# Patient Record
Sex: Female | Born: 1985 | Race: White | Hispanic: No | Marital: Married | Smoking: Current every day smoker
Health system: Southern US, Community
[De-identification: ages and names within clinical notes are randomized; demographics above are authoritative.]

## PROBLEM LIST (undated history)

## (undated) DIAGNOSIS — K219 Gastro-esophageal reflux disease without esophagitis: Secondary | ICD-10-CM

## (undated) DIAGNOSIS — Z79890 Hormone replacement therapy (postmenopausal): Secondary | ICD-10-CM

## (undated) DIAGNOSIS — K529 Noninfective gastroenteritis and colitis, unspecified: Secondary | ICD-10-CM

## (undated) DIAGNOSIS — I10 Essential (primary) hypertension: Secondary | ICD-10-CM

## (undated) DIAGNOSIS — F649 Gender identity disorder, unspecified: Secondary | ICD-10-CM

## (undated) DIAGNOSIS — F17200 Nicotine dependence, unspecified, uncomplicated: Secondary | ICD-10-CM

## (undated) DIAGNOSIS — Z7189 Other specified counseling: Secondary | ICD-10-CM

## (undated) DIAGNOSIS — D72829 Elevated white blood cell count, unspecified: Secondary | ICD-10-CM

## (undated) DIAGNOSIS — Z7289 Other problems related to lifestyle: Secondary | ICD-10-CM

## (undated) HISTORY — PX: TONSILLECTOMY: SUR1361

## (undated) HISTORY — DX: Nicotine dependence, unspecified, uncomplicated: F17.200

## (undated) HISTORY — DX: Elevated white blood cell count, unspecified: D72.829

## (undated) HISTORY — DX: Noninfective gastroenteritis and colitis, unspecified: K52.9

## (undated) HISTORY — DX: Gender identity disorder, unspecified: F64.9

## (undated) HISTORY — DX: Hormone replacement therapy: Z79.890

## (undated) HISTORY — DX: Gastro-esophageal reflux disease without esophagitis: K21.9

## (undated) HISTORY — DX: Other problems related to lifestyle: Z72.89

---

## 2008-09-11 ENCOUNTER — Ambulatory Visit (HOSPITAL_COMMUNITY): Admission: RE | Admit: 2008-09-11 | Discharge: 2008-09-11 | Payer: Self-pay | Admitting: Family Medicine

## 2011-01-14 ENCOUNTER — Ambulatory Visit: Payer: Self-pay | Admitting: Family Medicine

## 2011-01-28 ENCOUNTER — Ambulatory Visit: Payer: Self-pay | Admitting: Family Medicine

## 2011-02-04 ENCOUNTER — Ambulatory Visit (INDEPENDENT_AMBULATORY_CARE_PROVIDER_SITE_OTHER): Payer: 59 | Admitting: Family Medicine

## 2011-02-04 ENCOUNTER — Encounter: Payer: Self-pay | Admitting: Family Medicine

## 2011-02-04 VITALS — Ht 66.75 in | Wt 150.0 lb

## 2011-02-04 DIAGNOSIS — I10 Essential (primary) hypertension: Secondary | ICD-10-CM

## 2011-02-04 NOTE — Progress Notes (Signed)
Medical Nutrition Therapy:  Appt start time: 1130 end time:  1230.  Assessment:  Primary concerns today: blood pressure.  Mr. Yvonne Harrell would like to get his BP under control.  He does not eat regular sodas or flesh foods, but does eat eggs and dairy.  Usual eating pattern includes 3 meals and 0-1 snack per day. Everyday foods include meat substitutes, beans, and milk usually (milk upsets stomach).   24-hr recall suggests an intake of <1000 kcal: B (9 AM)- dill pickle spear, baby carrots, 1 orange, 1 apple; Snk (12 PM)- 1 apple, grape tomatoes; L (3 PM)- BK soy burger w/ chs, pickles, ketchup; D (9 PM)- 1 c Asian noodles w/ 1 c stir-fried veg's, water.  Usual physical activity includes "not much of anything."  Left knee was injured a couple of years ago, and is problematic if he walks much.  [name] eventually admitted to me that he is hesitant to see a physician about his knee b/c he's afraid that surgery would be recommended, w/ cadaver tendon used to repair.  We talked about how his knee limits hiking, which he used to enjoy, though, and that more physical activity would help his weight and BP.  [name] has lost ~10 lb, from a high of 160, but he said he's been unable to lose to a goal weight of 130-140.  We discussed his lack of exercise and possibility that extreme restriction has reduced his resting metabolic rate.  While [name] would like to get off his BP med's, he does now understand the importance of NOT quitting them suddenly or w/out physician approval (as he did last yr).    Progress Towards Goal(s):  In progress.   Nutritional Diagnosis:  NB-2.1 Physical inactivity As related to knee pain from prior injury.  As evidenced by no current exercise. NI-5.10.2 Excessive mineral intake (specify): sodium As related to fast food.  As evidenced by usual daily intake of fast food for lunch.    Intervention:  Nutrition education.  Monitoring/Evaluation:  Dietary intake, exercise, and body weight  in 1 months.

## 2011-02-04 NOTE — Patient Instructions (Addendum)
-   If you decide to pursue evaluation of your knee pain:  [facility]:  [phone] for appt.  (Ask re. Sports Med off [address] - Principles of blood pressure management:  - Keep sodium intake below 1500 mg/day.    - Keep weight below 158 lb.    - Limit alcohol to 1-2 drinks per week.    - Limit fat and saturated fat, especially.    - Physical activity of at least 150 minutes per week (more is better).    - Increase fruits and veg's, aiming for at least 4 veg's and 3 fruits/day.  (These are excellent sources of potassium.)  Yvonne Harrell's goals: 1. Make Sports Med appt for your knee.  2. Exploring gym membership options or access to stationary bike to experiment with your knee.   3. Track sodium intake, and aim for reducing.  This means looking online at fast food nutrient contents (sodium).  (My[employer]itness[employer] app.)  4. Eat at least 3 meals and 1-2 snacks per day.  Aim for no more than 5 hours between eating. 5. Obtain twice as many veg's as protein or carbohydrate foods for both lunch and dinner.  (V8 is an ok substitute at SOME meals, but not as a daily source of fruit; whole, real fruit/veg's are preferred.)  Suggest:  Try stir-frying some leafy greens (chard, kale, spinach) in XV olive oil and garlic.    Suggest:  Better carb sources include sweet potatoes, carrots, beets, parsnips, turnip (use a small amount of this one). 6. Until you manage to get more calcium and iron into your diet, a MVM supplement is probably a good idea.   (ULTIMATE GOAL:  GET OFF BP MED'S.)

## 2011-03-11 ENCOUNTER — Ambulatory Visit: Payer: 59 | Admitting: Family Medicine

## 2011-03-12 ENCOUNTER — Ambulatory Visit (INDEPENDENT_AMBULATORY_CARE_PROVIDER_SITE_OTHER): Payer: 59

## 2011-03-12 DIAGNOSIS — R059 Cough, unspecified: Secondary | ICD-10-CM

## 2011-03-12 DIAGNOSIS — R05 Cough: Secondary | ICD-10-CM

## 2011-03-12 DIAGNOSIS — I10 Essential (primary) hypertension: Secondary | ICD-10-CM

## 2011-03-15 ENCOUNTER — Ambulatory Visit (INDEPENDENT_AMBULATORY_CARE_PROVIDER_SITE_OTHER): Payer: 59

## 2011-03-15 ENCOUNTER — Ambulatory Visit
Admission: RE | Admit: 2011-03-15 | Discharge: 2011-03-15 | Disposition: A | Discharge status: Home / Self Care | Payer: 59 | Source: Ambulatory Visit | Attending: Internal Medicine | Admitting: Internal Medicine

## 2011-03-15 ENCOUNTER — Other Ambulatory Visit: Payer: Self-pay | Admitting: Internal Medicine

## 2011-03-15 DIAGNOSIS — R7989 Other specified abnormal findings of blood chemistry: Secondary | ICD-10-CM

## 2011-03-15 DIAGNOSIS — R1011 Right upper quadrant pain: Secondary | ICD-10-CM

## 2011-03-15 DIAGNOSIS — R05 Cough: Secondary | ICD-10-CM

## 2011-03-15 MED ORDER — IOHEXOL 300 MG/ML  SOLN
100.0000 mL | Freq: Once | INTRAMUSCULAR | Status: AC | PRN
  Administered 2011-03-15: 100 mL via INTRAVENOUS

## 2011-03-15 MED ORDER — ONDANSETRON HCL 4 MG/2ML IJ SOLN
8.0000 mg | Freq: Once | INTRAMUSCULAR | Status: AC
  Administered 2011-03-15: 8 mg via INTRAVENOUS

## 2011-03-16 ENCOUNTER — Ambulatory Visit (INDEPENDENT_AMBULATORY_CARE_PROVIDER_SITE_OTHER): Payer: 59

## 2011-03-16 DIAGNOSIS — R059 Cough, unspecified: Secondary | ICD-10-CM

## 2011-03-16 DIAGNOSIS — I10 Essential (primary) hypertension: Secondary | ICD-10-CM

## 2011-03-16 DIAGNOSIS — R05 Cough: Secondary | ICD-10-CM

## 2011-03-20 ENCOUNTER — Ambulatory Visit (INDEPENDENT_AMBULATORY_CARE_PROVIDER_SITE_OTHER): Payer: 59

## 2011-03-20 DIAGNOSIS — R059 Cough, unspecified: Secondary | ICD-10-CM

## 2011-03-20 DIAGNOSIS — I10 Essential (primary) hypertension: Secondary | ICD-10-CM

## 2011-03-20 DIAGNOSIS — R05 Cough: Secondary | ICD-10-CM

## 2011-03-20 DIAGNOSIS — R000 Tachycardia, unspecified: Secondary | ICD-10-CM

## 2011-03-21 ENCOUNTER — Encounter (HOSPITAL_COMMUNITY): Payer: Self-pay | Admitting: Emergency Medicine

## 2011-03-21 ENCOUNTER — Emergency Department (HOSPITAL_COMMUNITY): Payer: 59

## 2011-03-21 ENCOUNTER — Emergency Department (HOSPITAL_COMMUNITY)
Admission: EM | Admit: 2011-03-21 | Discharge: 2011-03-21 | Disposition: A | Discharge status: Home / Self Care | Payer: 59 | Attending: Emergency Medicine | Admitting: Emergency Medicine

## 2011-03-21 DIAGNOSIS — R000 Tachycardia, unspecified: Secondary | ICD-10-CM | POA: Insufficient documentation

## 2011-03-21 DIAGNOSIS — R079 Chest pain, unspecified: Secondary | ICD-10-CM | POA: Insufficient documentation

## 2011-03-21 DIAGNOSIS — Z79899 Other long term (current) drug therapy: Secondary | ICD-10-CM | POA: Insufficient documentation

## 2011-03-21 DIAGNOSIS — R112 Nausea with vomiting, unspecified: Secondary | ICD-10-CM | POA: Insufficient documentation

## 2011-03-21 DIAGNOSIS — R55 Syncope and collapse: Secondary | ICD-10-CM | POA: Insufficient documentation

## 2011-03-21 DIAGNOSIS — R05 Cough: Secondary | ICD-10-CM | POA: Insufficient documentation

## 2011-03-21 DIAGNOSIS — I10 Essential (primary) hypertension: Secondary | ICD-10-CM | POA: Insufficient documentation

## 2011-03-21 DIAGNOSIS — R059 Cough, unspecified: Secondary | ICD-10-CM | POA: Insufficient documentation

## 2011-03-21 LAB — CBC
HCT: 45.7 % (ref 39.0–52.0)
Hemoglobin: 16.9 g/dL (ref 13.0–17.0)
MCH: 32.8 pg (ref 26.0–34.0)
MCV: 88.7 fL (ref 78.0–100.0)
RBC: 5.15 MIL/uL (ref 4.22–5.81)

## 2011-03-21 LAB — COMPREHENSIVE METABOLIC PANEL
AST: 50 U/L — ABNORMAL HIGH (ref 0–37)
Albumin: 4.6 g/dL (ref 3.5–5.2)
Calcium: 9.5 mg/dL (ref 8.4–10.5)
Chloride: 103 mEq/L (ref 96–112)
Creatinine, Ser: 0.7 mg/dL (ref 0.50–1.35)
Total Protein: 8.3 g/dL (ref 6.0–8.3)

## 2011-03-21 MED ORDER — ONDANSETRON 4 MG PO TBDP: 4.0000 mg | ORAL_TABLET | Freq: Three times a day (TID) | ORAL | Status: AC | PRN

## 2011-03-21 NOTE — ED Provider Notes (Signed)
History     CSN: [id number]  Arrival date & time 03/21/11  4098   First MD Initiated Contact with Patient 03/21/11 2020      No chief complaint on file.   (Consider location/radiation/quality/duration/timing/severity/associated sxs/prior treatment) HPI  The patient is a 76 shoulder, female, who says that he has hereditary.  Tachycardia, and hypertension.  He states that he has had a persistent cough for greater than a month.  He has been seen by several physicians for his symptoms and he does not believe what he has been told so he came for another opinion.  He states that he has been on Tussionex for his cough.  He was effective, however, he does not like the way the hydrocodone makes him feel.  He states that he is unable to keep down any food because he has nausea and vomiting.  He is not nauseated at this time.  However.  He denies fevers, chills, chest pain, abdominal pain.  He says he is a vegetarian.  He always has loose stools.  He has not smoked for greater than one month.  He drinks 20 ounces of caffeinated sodas daily.  He has been prescribed metoprolol 50 mg per day, prednisone for bronchitis Tessalon Perles for cough, Prilosec for gastric reflux.  Amlodipine for hypertension, lisinopril for hypertension  History reviewed. No pertinent past medical history.  History reviewed. No pertinent past surgical history.  No family history on file.  History  Substance Use Topics  . Smoking status: Former Smoker    Quit date: 12/05/2010  . Smokeless tobacco: Never Used  . Alcohol Use: 1.0 oz/week    2 drink(s) per week      Review of Systems  Constitutional: Negative for fever.  HENT: Negative for congestion.   Respiratory: Positive for cough. Negative for shortness of breath.   Cardiovascular: Negative for chest pain.  Gastrointestinal: Positive for nausea and vomiting. Negative for abdominal pain and diarrhea.  Neurological: Positive for syncope. Negative for headaches.    Psychiatric/Behavioral: Negative for confusion.  All other systems reviewed and are negative.    Allergies  Review of patient's allergies indicates no known allergies.  Home Medications   Current Outpatient Rx  Name Route Sig Dispense Refill  . AMLODIPINE BESYLATE 10 MG PO TABS Oral Take 10 mg by mouth daily.    Marland Kitchen BENZONATATE 200 MG PO CAPS Oral Take 200 mg by mouth 3 (three) times daily as needed. For cough.    . METOPROLOL SUCCINATE ER 100 MG PO TB24 Oral Take 100 mg by mouth daily. Take with or immediately following a meal.    . OMEPRAZOLE 40 MG PO CPDR Oral Take 40 mg by mouth daily.    Marland Kitchen PREDNISONE 20 MG PO TABS Oral Take 40 mg by mouth daily.       BP 134/95  Pulse 108  Temp(Src) 99.1 F (37.3 C) (Oral)  Resp 20  SpO2 100%  Physical Exam  Vitals reviewed. Constitutional: He is oriented to person, place, and time. He appears well-developed and well-nourished. No distress.       Hacking intermittent, cough  HENT:  Head: Normocephalic and atraumatic.  Eyes: EOM are normal. Pupils are equal, round, and reactive to light.  Neck: Normal range of motion. Neck supple. No thyromegaly present.  Cardiovascular: Regular rhythm and normal heart sounds.   No murmur heard.      Tachycardia  Pulmonary/Chest: Effort normal and breath sounds normal. No respiratory distress. He has no wheezes.  He has no rales.  Musculoskeletal: Normal range of motion. He exhibits no edema and no tenderness.  Neurological: He is alert and oriented to person, place, and time. No cranial nerve deficit.  Skin: Skin is warm and dry. He is not diaphoretic.  Psychiatric: He has a normal mood and affect. His behavior is normal.    ED Course  Procedures (including critical care time)  Labs Reviewed  COMPREHENSIVE METABOLIC PANEL - Abnormal; Notable for the following:    Glucose, Bld 137 (*)    AST 50 (*)    All other components within normal limits  CBC - Abnormal; Notable for the following:    WBC  11.9 (*)    MCHC 37.0 (*)    All other components within normal limits   Dg Chest 2 View  03/21/2011  *RADIOLOGY REPORT*  Clinical Data: Cough, chest pain  CHEST - 2 VIEW  Comparison: None.  Findings: Lungs are clear. No pleural effusion or pneumothorax.  Cardiomediastinal silhouette is within normal limits.  Visualized osseous structures are within normal limits.  IMPRESSION: No evidence of acute cardiopulmonary disease.  Original Report Authenticated By: Charline Bills, M.D.     No diagnosis found.    MDM  Tachycardia Bronchitis        Nicholes Stairs, MD 03/21/11 2122

## 2011-03-21 NOTE — ED Notes (Signed)
Caporossi, MD at bedside. 

## 2011-03-21 NOTE — ED Notes (Signed)
Per Pt: syncopal episode with LOC (breathing normal) today per wife that lasted several minutes on couch. No injury.  Cough for several weeks, has had EKG, CT, Xray at Urgent Care. Reports he is here because he feels like he is "getting worse". Pt VSS, in no apparent distress, ambulates WNL.

## 2011-03-25 DIAGNOSIS — Z0271 Encounter for disability determination: Secondary | ICD-10-CM

## 2011-10-24 ENCOUNTER — Encounter (HOSPITAL_COMMUNITY): Payer: Self-pay | Admitting: Emergency Medicine

## 2011-10-24 ENCOUNTER — Emergency Department (HOSPITAL_COMMUNITY)
Admission: EM | Admit: 2011-10-24 | Discharge: 2011-10-24 | Disposition: A | Discharge status: Home / Self Care | Payer: 59 | Attending: Emergency Medicine | Admitting: Emergency Medicine

## 2011-10-24 DIAGNOSIS — Z79899 Other long term (current) drug therapy: Secondary | ICD-10-CM | POA: Insufficient documentation

## 2011-10-24 DIAGNOSIS — F411 Generalized anxiety disorder: Secondary | ICD-10-CM | POA: Insufficient documentation

## 2011-10-24 DIAGNOSIS — F419 Anxiety disorder, unspecified: Secondary | ICD-10-CM

## 2011-10-24 DIAGNOSIS — I10 Essential (primary) hypertension: Secondary | ICD-10-CM

## 2011-10-24 DIAGNOSIS — F17200 Nicotine dependence, unspecified, uncomplicated: Secondary | ICD-10-CM | POA: Insufficient documentation

## 2011-10-24 HISTORY — DX: Essential (primary) hypertension: I10

## 2011-10-24 HISTORY — DX: Other specified counseling: Z71.89

## 2011-10-24 LAB — POCT I-STAT, CHEM 8
Chloride: 101 mEq/L (ref 96–112)
Creatinine, Ser: 0.7 mg/dL (ref 0.50–1.35)
Glucose, Bld: 109 mg/dL — ABNORMAL HIGH (ref 70–99)
Potassium: 3.5 mEq/L (ref 3.5–5.1)
Sodium: 140 mEq/L (ref 135–145)

## 2011-10-24 MED ORDER — LORAZEPAM 1 MG PO TABS
1.0000 mg | ORAL_TABLET | Freq: Once | ORAL | Status: AC
  Administered 2011-10-24: 1 mg via ORAL
  Filled 2011-10-24: qty 1

## 2011-10-24 MED ORDER — LORAZEPAM 1 MG PO TABS: 1.0000 mg | ORAL_TABLET | Freq: Three times a day (TID) | ORAL | Status: AC | PRN

## 2011-10-24 NOTE — ED Provider Notes (Signed)
History     CSN: [id number]  Arrival date & time 10/24/11  1032   First MD Initiated Contact with Patient 10/24/11 1117      Chief Complaint  Patient presents with  . Hypertension    (Consider location/radiation/quality/duration/timing/severity/associated sxs/prior treatment) HPI Comments: Yvonne Harrell is a 26 y.o. Female who presents to ER with elevated blood pressure. PT states he started HRT for sex change 4 days ago. Since then he noticed feeling "bad." Reports nausea, intermittent vomiting, sweats, and elevated blood pressure. Pt states yesterday, he measured his BP and it was 200/110. Pt does take BP medication, he takes lopressor, and no recent dosage changes have been made. BP has been managed well on this medication. Per pt's wife, pt has not been feeling well since his replacement. He has not slept last night at all. Pt is very anxious. He however denies chest pain, SOB, leg swelling, abdominal pain, problems with bowels or urination.    Past Medical History  Diagnosis Date  . Counseling for hormone replacement therapy     started replacement therapy on 10/21/11  . Hypertension     Past Surgical History  Procedure Date  . Tonsillectomy     History reviewed. No pertinent family history.  History  Substance Use Topics  . Smoking status: Current Everyday Smoker -- 0.5 packs/day    Last Attempt to Quit: 12/05/2010  . Smokeless tobacco: Never Used  . Alcohol Use: 1.0 oz/week    2 drink(s) per week     occasionally      Review of Systems  Constitutional: Positive for diaphoresis. Negative for fever, chills and fatigue.  HENT: Negative for neck pain and neck stiffness.   Respiratory: Negative.   Cardiovascular: Negative.   Gastrointestinal: Positive for nausea and vomiting. Negative for abdominal pain.  Genitourinary: Negative for dysuria and flank pain.  Musculoskeletal: Negative.   Skin: Negative.   Neurological: Negative for dizziness, weakness, numbness  and headaches.    Allergies  Review of patient's allergies indicates no known allergies.  Home Medications   Current Outpatient Rx  Name Route Sig Dispense Refill  . ASPIRIN EC 81 MG PO TBEC Oral Take 81 mg by mouth daily.    Marland Kitchen ESTRADIOL 2 MG PO TABS Oral Take 2 mg by mouth 2 (two) times daily.    Marland Kitchen MEDROXYPROGESTERONE ACETATE 400 MG/ML IM SUSP Intramuscular Inject 200 mg into the muscle every 3 (three) months.    Marland Kitchen METOPROLOL TARTRATE 50 MG PO TABS Oral Take 50 mg by mouth 2 (two) times daily.      BP 155/104  Pulse 84  Temp 99.7 F (37.6 C) (Oral)  Resp 16  SpO2 100%  Physical Exam  Nursing note and vitals reviewed. Constitutional: He is oriented to person, place, and time. He appears well-developed and well-nourished. No distress.  HENT:  Head: Normocephalic and atraumatic.  Eyes: Conjunctivae are normal.  Neck: Neck supple.  Cardiovascular: Normal rate, regular rhythm and normal heart sounds.   Pulmonary/Chest: Effort normal and breath sounds normal. No respiratory distress. He has no wheezes. He has no rales.  Abdominal: Soft. Bowel sounds are normal. He exhibits no distension. There is no tenderness. There is no rebound.  Musculoskeletal: He exhibits no edema.       No calf tenderness or LE swelling  Neurological: He is alert and oriented to person, place, and time.       Pt very tremulous. Per him and his wife, that is his baseline  Skin: Skin is warm. No rash noted. He is diaphoretic.  Psychiatric:       Pt appears very anxious    ED Course  Procedures (including critical care time)   Date: 10/24/2011  Rate: 85  Rhythm: normal sinus rhythm  QRS Axis: normal  Intervals: borderline prolonged QT  ST/T Wave abnormalities: normal  Conduction Disutrbances:none  Narrative Interpretation:   Old EKG Reviewed: none available  Pt very anxious. Hx of the same. BP elevated here. No missed  Doses per him. WIll get renal function, troponin, ecg. Results for orders  placed during the hospital encounter of 10/24/11  POCT I-STAT TROPONIN I      Component Value Range   Troponin i, poc 0.00  0.00 - 0.08 ng/mL   Comment 3           POCT I-STAT, CHEM 8      Component Value Range   Sodium 140  135 - 145 mEq/L   Potassium 3.5  3.5 - 5.1 mEq/L   Chloride 101  96 - 112 mEq/L   BUN 6  6 - 23 mg/dL   Creatinine, Ser 1.61  0.50 - 1.35 mg/dL   Glucose, Bld 096 (*) 70 - 99 mg/dL   Calcium, Ion 0.45  4.09 - 1.23 mmol/L   TCO2 25  0 - 100 mmol/L   Hemoglobin 17.7 (*) 13.0 - 17.0 g/dL   HCT 81.1  91.4 - 78.2 %   1:18 PM Pt hypertensive, normal renal function, normal ECG. He is anxious, i gave him ativan. He has no signs of possible PE or dvt. i suspect his symptoms are related to newly started dep and estradiol. I explained to him that no additional treatment necessary at this point, but he must call his physician and notify them of his symptoms and follow up with them as soon as possible. Return if symptoms worsening. Ativan for anxiety. Do not check BP every 20 min as he did yesterday.     1. Hypertension   2. Anxiety       MDM          Lottie Mussel, PA 10/24/11 1320

## 2011-10-24 NOTE — ED Notes (Addendum)
Here because BP not controlled with medicine, has had recently started HRT-- 158/98 manually. Has been nauseated, vomited last night.  Gave self Depo shot in right gluteus- states it is red and painful -- given on Monday

## 2011-10-24 NOTE — ED Notes (Signed)
PA at bedside Pt alert and oriented x4. Respirations even and unlabored, bilateral symmetrical rise and fall of chest. Skin warm and dry. In no acute distress. Denies needs.   

## 2011-10-25 NOTE — ED Provider Notes (Signed)
Medical screening examination/treatment/procedure(s) were conducted as a shared visit with non-physician practitioner(s) and myself.  I personally evaluated the patient during the encounter     Cyndra Numbers, MD 10/25/11 [phone]

## 2012-01-05 ENCOUNTER — Other Ambulatory Visit: Payer: Self-pay | Admitting: Family Medicine

## 2012-01-05 ENCOUNTER — Other Ambulatory Visit: Payer: Self-pay | Admitting: Physician Assistant

## 2012-01-05 NOTE — Addendum Note (Signed)
Addended by: Fernande Bras on: 01/05/2012 06:56 PM   Modules accepted: Orders

## 2012-01-27 ENCOUNTER — Ambulatory Visit (INDEPENDENT_AMBULATORY_CARE_PROVIDER_SITE_OTHER): Payer: 59 | Admitting: Physician Assistant

## 2012-01-27 ENCOUNTER — Encounter: Payer: Self-pay | Admitting: Physician Assistant

## 2012-01-27 VITALS — BP 122/78 | HR 109 | Temp 98.4°F | Resp 18 | Ht 66.0 in | Wt 155.0 lb

## 2012-01-27 DIAGNOSIS — I10 Essential (primary) hypertension: Secondary | ICD-10-CM

## 2012-01-27 DIAGNOSIS — Z720 Tobacco use: Secondary | ICD-10-CM

## 2012-01-27 DIAGNOSIS — B009 Herpesviral infection, unspecified: Secondary | ICD-10-CM

## 2012-01-27 DIAGNOSIS — F17200 Nicotine dependence, unspecified, uncomplicated: Secondary | ICD-10-CM

## 2012-01-27 DIAGNOSIS — B001 Herpesviral vesicular dermatitis: Secondary | ICD-10-CM

## 2012-01-27 DIAGNOSIS — Z79899 Other long term (current) drug therapy: Secondary | ICD-10-CM

## 2012-01-27 LAB — COMPREHENSIVE METABOLIC PANEL
ALT: 20 U/L (ref 0–53)
Albumin: 4.8 g/dL (ref 3.5–5.2)
Alkaline Phosphatase: 72 U/L (ref 39–117)
Potassium: 4.2 mEq/L (ref 3.5–5.3)
Sodium: 135 mEq/L (ref 135–145)
Total Bilirubin: 0.3 mg/dL (ref 0.3–1.2)
Total Protein: 7 g/dL (ref 6.0–8.3)

## 2012-01-27 LAB — CBC
MCH: 32.5 pg (ref 26.0–34.0)
MCHC: 36.4 g/dL — ABNORMAL HIGH (ref 30.0–36.0)
MCV: 89.3 fL (ref 78.0–100.0)
Platelets: 336 10*3/uL (ref 150–400)
RDW: 12.5 % (ref 11.5–15.5)

## 2012-01-27 MED ORDER — VARENICLINE TARTRATE 1 MG PO TABS: 1.0000 mg | ORAL_TABLET | Freq: Two times a day (BID) | ORAL | Status: DC

## 2012-01-27 MED ORDER — METOPROLOL TARTRATE 50 MG PO TABS: 50.0000 mg | ORAL_TABLET | Freq: Two times a day (BID) | ORAL | Status: DC

## 2012-01-27 MED ORDER — VARENICLINE TARTRATE 0.5 MG X 11 & 1 MG X 42 PO MISC: ORAL | Status: DC

## 2012-01-27 MED ORDER — VALACYCLOVIR HCL 1 G PO TABS: ORAL_TABLET | ORAL | Status: DC

## 2012-01-27 MED ORDER — SPIRONOLACTONE-HCTZ 25-25 MG PO TABS: 1.0000 | ORAL_TABLET | Freq: Every day | ORAL | Status: DC

## 2012-01-27 MED ORDER — LISINOPRIL 20 MG PO TABS: 20.0000 mg | ORAL_TABLET | Freq: Every day | ORAL | Status: DC

## 2012-01-27 NOTE — Progress Notes (Signed)
Patient ID: Yvonne Harrell MRN: [id number], DOB: 03-28-1985, 26 y.o. Date of Encounter: 01/27/2012, 1:10 PM  Primary Physician: No primary provider on file.  Chief Complaint: HTN  HPI: 26 y.o. year old female with history below presents for hypertension follow up. Doing well. Blood pressure is much improved since prior ED visits earlier this year. Currently taking Lisinopril 20 mg daily, Metoprolol 50 mg BID, Aldactazide 25/25 mg daily without issue. Was started on the Aldactazide by Dr. Ruby Cola, MD managing his HRT, in October. Has felt fine since his ED visit for elevated BP in August. No chest pain, shortness of breath, palpitations, tachypnea, or dyspnea. No headache or focal deficits.   He also requests to restart Chantix. We originally wrote this for him about one year ago, and he almost completed therapy. He did stop smoking completely. However, he did go through a stressful time in his life causing him to go back to smoking tobacco. He wants to quit again and requests a prescription for the starter pack today. He also understands the increased risk of tobacco use and HRT causing potentially life threatening blood clots in his legs and lungs.   He requests a refill of his Valtrex episodic prescription for herpes labialis. No current flare, but prior prescription expired, and he needs a new one to have on hand.   His HRT is managed by Dr. Rosario Jacks. He was last seen one month ago for routine follow up. No issues were found at that visit. His next follow up is May 2014. He is not having any adverse effects with his HRT. No chest pain, SOB, dyspnea, or tachypnea. No swelling, erythema, or pain in the legs. He is aware that HRT can cause life threatening blood clots and that smoking increases this risk. He accepts this risk.     Past Medical History  Diagnosis Date  . Counseling for hormone replacement therapy     started replacement therapy on 10/21/11  . Hypertension      Home  Meds: Prior to Admission medications   Medication Sig Start Date End Date Taking? Authorizing Provider  aspirin EC 81 MG tablet Take 81 mg by mouth daily.   Yes Historical Provider, MD  estradiol (ESTRACE) 2 MG tablet Take 2 mg by mouth 2 (two) times daily.   Yes Historical Provider, MD  medroxyPROGESTERone (DEPO-PROVERA) 400 MG/ML SUSP Inject 200 mg into the muscle every 3 (three) months.   Yes Historical Provider, MD  metoprolol (LOPRESSOR) 50 MG tablet Take 50 mg by mouth 2 (two) times daily.   Yes Historical Provider, MD    Allergies: No Known Allergies  History   Social History  . Marital Status: Married    Spouse Name: N/A    Number of Children: N/A  . Years of Education: N/A   Occupational History  . Not on file.   Social History Main Topics  . Smoking status: Current Every Day Smoker -- 0.5 packs/day    Last Attempt to Quit: 12/05/2010  . Smokeless tobacco: Never Used  . Alcohol Use: 1.0 oz/week    2 drink(s) per week     Comment: occasionally  . Drug Use: No  . Sexually Active: Yes    Birth Control/ Protection: None   Other Topics Concern  . Not on file   Social History Narrative  . No narrative on file     No family history on file.  Review of Systems: Constitutional: negative for chills, fever, night sweats, weight changes,  or fatigue  HEENT: negative for vision changes or hearing loss Cardiovascular: negative for chest pain, palpitations, or DOE Respiratory: negative for hemoptysis, wheezing, dyspnea, tachypnea, shortness of breath, or cough Abdominal: negative for abdominal pain, nausea, vomiting, diarrhea, or constipation Dermatological: negative for rash Neurologic: negative for headache, dizziness, or syncope   Physical Exam: Blood pressure 122/78, pulse 109, temperature 98.4 F (36.9 C), temperature source Oral, resp. rate 18, height 5\' 6"  (1.676 m), weight 155 lb (70.308 kg), SpO2 100.00%., Body mass index is 25.02 kg/(m^2). General: Well  developed, well nourished, in no acute distress. Head: Normocephalic, atraumatic, eyes without discharge, sclera non-icteric, nares are without discharge. Bilateral auditory canals clear, TM's are without perforation, pearly grey and translucent with reflective cone of light bilaterally. Oral cavity moist, posterior pharynx without exudate, erythema, peritonsillar abscess, or post nasal drip.  Neck: Supple. No thyromegaly. Full ROM. No lymphadenopathy. No carotid bruits. Lungs: Clear bilaterally to auscultation without wheezes, rales, or rhonchi. Breathing is unlabored. Heart: RRR with S1 S2. No murmurs, rubs, or gallops appreciated.  Msk:  Strength and tone normal for age. Extremities/Skin: Warm and dry. No clubbing or cyanosis. No edema. No rashes or suspicious lesions. Distal pulses 2+ and equal bilaterally. Calves supple and equal circumference bilaterally.  Neuro: Alert and oriented X 3. Moves all extremities spontaneously. Gait is normal. CNII-XII grossly in tact. DTR 2+, cerebellar function intact. Rhomberg normal. Psych:  Responds to questions appropriately with a normal affect. Mildly anxious.   Labs:  CMP and CBC pending.  ASSESSMENT AND PLAN:  26 y.o. year old female with well controlled hypertension, tobacco use, history of herpes labialis, and HRT/high risk medication use.   1. Hypertension -Well controlled -Continue current treatment -Refilled medication -Initially he requests that we just refill his Metoprolol today stating that Dr. Ruby Cola has been writing for his Lisinopril and Aldactazide. However, I feel it would be safer for the patient if just one prescriber were managing his hypertension, so we will manage it for him. He feels like this is a good plan. -Lisinopril 20 mg 1 po daily #90 RF 2 -Metoprolol 50 mg 1 po bid #180 RF 2 -Aldactazide 25/25 mg 1 po daily #90 RF 2 -Healthy diet and exercise -Weight loss -Await labs -Follow up 6-8 months  2. Tobacco use -Knows he  must stop smoking -Wants to stop smoking -Knows he is at an increased risk of DVT/PE with concurrent use of HRT, he accepts this risk -Previously on Chantix and tolerated well. Unfortunately restarted smoking again during stressful time in life. He requests to restart this again today.  -Chantix Starter Kit As directed #1 no RF -Chantix Continuing month Kit As directed RF 3, to keep on file -Patient given strict RTC/ER precautions -Safety contract  3. Herpes labialis  -No current flare -Valtrex 1000 mg 2 po q 12 hours for 1 day, start with symptom onset #90 RF 2  4. HRT/High risk medication use -Followed by Dr. Ruby Cola -Next appointment May 2014 -Patient already knows and understands risks of HRT/smoking with DVT/PE -Patient knows symptoms of DVT/PE -Following provider does have him on daily aspirin   Signed, Eula Listen, PA-C 01/27/2012 1:10 PM

## 2012-01-28 ENCOUNTER — Other Ambulatory Visit: Payer: Self-pay | Admitting: *Deleted

## 2012-01-28 MED ORDER — VARENICLINE TARTRATE 0.5 MG X 11 & 1 MG X 42 PO MISC: ORAL | Status: DC

## 2012-01-28 MED ORDER — VARENICLINE TARTRATE 1 MG PO TABS: 1.0000 mg | ORAL_TABLET | Freq: Two times a day (BID) | ORAL | Status: DC

## 2012-01-29 ENCOUNTER — Encounter: Payer: Self-pay | Admitting: *Deleted

## 2012-02-05 ENCOUNTER — Telehealth: Payer: Self-pay

## 2012-02-05 NOTE — Telephone Encounter (Signed)
PT STATES HE RECEIVED A LETTER STATING WE HAD BEEN TRYING TO GET IN [facility] WITH HIM PLEASE CALL [phone]

## 2012-02-06 NOTE — Telephone Encounter (Signed)
Contacted pt and gave him lab results and instr's to come back to have them rechecked in 1 -3 weeks from now. Pt agreed. Also faxed a copy of labs to Dr Ruby Cola per Ryan's request.

## 2012-02-13 ENCOUNTER — Telehealth: Payer: Self-pay

## 2012-02-13 NOTE — Telephone Encounter (Signed)
Pt states his rx for spironolactone hctz was dispensed incorrectly. States he only received 30 pills instead of 90. And instructions say take 1 by mouth daily when previous instructions said take 2.   Pt concerned.  Please call. Bf  Pharmacy: [facility] [address]

## 2012-02-14 NOTE — Telephone Encounter (Signed)
According to last OV, Dr. Ruby Cola was prescribing this previously, and our records show he was on 1 tablet daily. He was given #90 with 2 RF on 11/26.

## 2012-02-14 NOTE — Telephone Encounter (Signed)
Called [facility] pharmacy to verify Aldactazide 25/25 mg. He previously had been on this medication bid. His insurance will only cover 30 day supplies per [facility]. Please notify the patient that I have changed his medication to bid, he can double up the bottle he just purchased and take that bid until he runs out then pick up the new bottle. Please also notify him that they are stating his insurance will only allow 30 day supply. He can call his insurance to discuss this with them if he would like. I am sorry for the confusion.

## 2012-02-14 NOTE — Telephone Encounter (Signed)
Chart at PA desk 

## 2012-02-14 NOTE — Telephone Encounter (Signed)
Thanks, I called patient to advise, left message, he was advised.

## 2012-02-24 ENCOUNTER — Encounter: Payer: Self-pay | Admitting: Physician Assistant

## 2012-02-24 ENCOUNTER — Ambulatory Visit (INDEPENDENT_AMBULATORY_CARE_PROVIDER_SITE_OTHER): Payer: 59 | Admitting: Physician Assistant

## 2012-02-24 DIAGNOSIS — Z79899 Other long term (current) drug therapy: Secondary | ICD-10-CM

## 2012-02-24 DIAGNOSIS — D7289 Other specified disorders of white blood cells: Secondary | ICD-10-CM

## 2012-02-24 LAB — COMPREHENSIVE METABOLIC PANEL
ALT: 22 U/L (ref 0–53)
Alkaline Phosphatase: 63 U/L (ref 39–117)
Creat: 0.78 mg/dL (ref 0.50–1.35)
Sodium: 133 mEq/L — ABNORMAL LOW (ref 135–145)
Total Bilirubin: 0.5 mg/dL (ref 0.3–1.2)
Total Protein: 7.6 g/dL (ref 6.0–8.3)

## 2012-02-24 LAB — CBC
MCH: 32.5 pg (ref 26.0–34.0)
MCHC: 35.8 g/dL (ref 30.0–36.0)
MCV: 90.7 fL (ref 78.0–100.0)
Platelets: 319 10*3/uL (ref 150–400)
RBC: 4.28 MIL/uL (ref 4.22–5.81)
RDW: 12.8 % (ref 11.5–15.5)

## 2012-02-24 NOTE — Progress Notes (Signed)
Patient ID: Yvonne Harrell MRN: [id number], DOB: [birth year], 26 y.o. Date of Encounter: 02/24/2012, 4:21 PM  Primary Physician: No primary provider on file.  Chief Complaint: Recheck labs  HPI: 26 y.o. year old female with history below presents for recheck of elevated calcium and leukocytosis drawn on 01/27/12 office visit. Patient has been taking combo vitamin D and calcium OTC supplement bid as well as spironolactone/HCTZ 25/25 mg bid. Since he was notified of his elevated calcium at his last visit he decreased his OTC calcium supplement to once daily. At this time he is still taking the spironolactone/HCTZ combo.   He is also here to recheck a leukocytosis from the same office visit. He states that he was feeling fine at the time of that office visit. No recent illnesses or pains. There had been several coworkers sick, but not himself.   He is tolerating the Chantix without issues. He quit smoking tobacco about two weeks ago and is doing well. He states that "they just don't taste good."    Past Medical History  Diagnosis Date  . Counseling for hormone replacement therapy     started replacement therapy on 10/21/11  . Hypertension      Home Meds: Prior to Admission medications   Medication Sig Start Date End Date Taking? Authorizing Provider  aspirin EC 81 MG tablet Take 81 mg by mouth daily.   Yes Historical Provider, MD  estradiol (ESTRACE) 2 MG tablet Take 2 mg by mouth 2 (two) times daily.   Yes Historical Provider, MD  lisinopril (PRINIVIL,ZESTRIL) 20 MG tablet Take 1 tablet (20 mg total) by mouth daily. 01/27/12  Yes Maurita Havener M Samyrah Bruster, PA-C  medroxyPROGESTERone (DEPO-PROVERA) 400 MG/ML SUSP Inject 200 mg into the muscle every 3 (three) months.   Yes Historical Provider, MD  metoprolol (LOPRESSOR) 50 MG tablet Take 1 tablet (50 mg total) by mouth 2 (two) times daily. 01/27/12  Yes [name], PA-C  spironolactone-hydrochlorothiazide (ALDACTAZIDE) 25-25 MG per tablet Take 1 tablet  by mouth daily. 01/27/12  Yes [name], PA-C  valACYclovir (VALTREX) 1000 MG tablet 2 PO Q 12 HOURS FOR 1 DAY; START WITH SYMPTOM ONSET 01/27/12  Yes [name], PA-C  varenicline (CHANTIX CONTINUING MONTH PAK) 1 MG tablet Take 1 tablet (1 mg total) by mouth 2 (two) times daily. 01/28/12  Yes [name], PA-C  varenicline (CHANTIX PAK) 0.5 MG X 11 & 1 MG X 42 tablet Use as directed in pack 01/28/12  Yes [name], PA-C    Allergies: No Known Allergies  History   Social History  . Marital Status: Married    Spouse Name: N/A    Number of Children: N/A  . Years of Education: N/A   Occupational History  . Not on file.   Social History Main Topics  . Smoking status: Current Every Day Smoker -- 0.5 packs/day    Last Attempt to Quit: 12/05/2010  . Smokeless tobacco: Never Used  . Alcohol Use: 1.0 oz/week    2 drink(s) per week     Comment: occasionally  . Drug Use: No  . Sexually Active: Yes    Birth Control/ Protection: None   Other Topics Concern  . Not on file   Social History Narrative  . No narrative on file     Review of Systems: Constitutional: negative for chills, fever, night sweats, weight changes, or fatigue  HEENT: negative for vision changes, hearing loss, congestion, rhinorrhea, ST, epistaxis, or sinus pressure  Cardiovascular: negative for chest pain or palpitations Respiratory: negative for hemoptysis, wheezing, shortness of breath, or cough Abdominal: negative for abdominal pain, nausea, vomiting, diarrhea, or constipation Dermatological: negative for rash Neurologic: negative for headache, dizziness, or syncope   Physical Exam: Blood pressure 128/70, pulse 112, temperature 98.2 F (36.8 C), temperature source Oral, resp. rate 16, height 5\' 6"  (1.676 m), weight 156 lb 6.4 oz (70.943 kg), SpO2 98.00%., Body mass index is 25.24 kg/(m^2). General: Well developed, well nourished, in no acute distress. Head: Normocephalic, atraumatic, eyes without  discharge, sclera non-icteric, nares are without discharge.   Neck: Supple. No thyromegaly. Full ROM. No lymphadenopathy. Lungs: Clear bilaterally to auscultation without wheezes, rales, or rhonchi. Breathing is unlabored. Heart: RRR with S1 S2. No murmurs, rubs, or gallops appreciated. Msk:  Strength and tone normal for age. Extremities/Skin: Warm and dry. No clubbing or cyanosis. No edema. No rashes or suspicious lesions. Neuro: Alert and oriented X 3. Moves all extremities spontaneously. Gait is normal. CNII-XII grossly in tact. Psych:  Responds to questions appropriately with a normal affect.   Labs: CBC and CMP pending.   ASSESSMENT AND PLAN:  26 y.o. year old female with hypercalcemia, leukocytosis, HRT, HTN, and tobacco abuse.   1. Hypercalcemia  -Recheck calcium level -He has decreased his OTC calcium supplement from bid to once daily -If calcium remains elevated today, will stop HCTZ as well as OTC calcium supplement, will have patient recheck calcium again in 10 days for lab only visit and draw extra tubes for the possible addition of add on lab tests.  2. Leukocytosis -Recheck WBC count -Patient states that he was feeling just fine when this was drawn at his visit on 01/27/12 -Follow up pending results  3. HRT -Followed by Dr. Ruby Cola -Next appointment May 2014 -Patient already knows and understands risks of HRT/smoking with DVT/PE -911 precautions  4. HTN -Well controlled -Continue current medications at this time. I did outline a possible new regimen with him pending his calcium level drawn today.  -Healthy diet  5. Tobacco use -Kudos for quitting smoking -Tolerating Chantix without issue   Signed, Eula Listen, PA-C 02/24/2012 4:21 PM

## 2012-02-25 ENCOUNTER — Other Ambulatory Visit: Payer: Self-pay | Admitting: Physician Assistant

## 2012-02-25 DIAGNOSIS — D72829 Elevated white blood cell count, unspecified: Secondary | ICD-10-CM

## 2012-03-04 ENCOUNTER — Other Ambulatory Visit (INDEPENDENT_AMBULATORY_CARE_PROVIDER_SITE_OTHER): Payer: BC Managed Care – PPO

## 2012-03-04 DIAGNOSIS — D72829 Elevated white blood cell count, unspecified: Secondary | ICD-10-CM

## 2012-03-04 NOTE — Progress Notes (Signed)
Patient here for labs only. 

## 2012-03-08 ENCOUNTER — Telehealth: Payer: Self-pay

## 2012-03-08 NOTE — Telephone Encounter (Signed)
° °  RETURN CALL [facility]  [phone]

## 2012-03-08 NOTE — Telephone Encounter (Signed)
Notes Recorded by Johnnette Litter, CMA on 03/07/2012 at 2:02 PM Left message on machine to call back ------  Notes Recorded by Sondra Barges, PA-C on 03/05/2012 at 4:28 PM Please call the patient. Path review of blood smear indicated mature cells favoring a reactive process. In the setting of him feeling ok at his previous two visits with me lets recheck a CBC in 4 weeks.

## 2012-03-09 NOTE — Telephone Encounter (Signed)
Spoke with pt advised results. Pt understood.

## 2012-03-18 ENCOUNTER — Telehealth: Payer: Self-pay | Admitting: *Deleted

## 2012-03-18 NOTE — Telephone Encounter (Signed)
Pt called about lab results and was given lab results. Pt will follow up in feb

## 2012-05-05 ENCOUNTER — Other Ambulatory Visit: Payer: Self-pay | Admitting: Radiology

## 2012-05-05 DIAGNOSIS — I10 Essential (primary) hypertension: Secondary | ICD-10-CM

## 2012-05-05 NOTE — Telephone Encounter (Signed)
Ryan please review this and see if you want to renew his metoprolol  and Spironolactone/ hctz for 90 days to [facility].

## 2012-05-06 NOTE — Telephone Encounter (Signed)
[facility].

## 2012-05-07 MED ORDER — METOPROLOL TARTRATE 50 MG PO TABS: 50.0000 mg | ORAL_TABLET | Freq: Two times a day (BID) | ORAL | Status: DC

## 2012-05-12 ENCOUNTER — Other Ambulatory Visit: Payer: Self-pay | Admitting: Radiology

## 2012-05-12 DIAGNOSIS — I10 Essential (primary) hypertension: Secondary | ICD-10-CM

## 2012-05-12 MED ORDER — SPIRONOLACTONE-HCTZ 25-25 MG PO TABS: 1.0000 | ORAL_TABLET | Freq: Every day | ORAL | Status: DC

## 2012-05-12 NOTE — Telephone Encounter (Signed)
Sent med refills in for patient 90days

## 2012-06-01 ENCOUNTER — Telehealth: Payer: Self-pay

## 2012-06-01 DIAGNOSIS — I10 Essential (primary) hypertension: Secondary | ICD-10-CM

## 2012-06-01 NOTE — Telephone Encounter (Signed)
PATIENT CALLED SAYING HIS RX: spironolactone-hydrochlorothiazide (ALDACTAZIDE) 25-25 MG per tablet WAS CALLED IN FOR 1 TAB BY MOUTH DAILY BUT IT NEEDS TO BE CHANGED TO 2 TABS BY MOUTH DAILY.  [facility] ON [address]

## 2012-06-02 MED ORDER — SPIRONOLACTONE-HCTZ 25-25 MG PO TABS: 2.0000 | ORAL_TABLET | Freq: Every day | ORAL | Status: DC

## 2012-06-02 NOTE — Telephone Encounter (Signed)
Noted  

## 2012-06-02 NOTE — Telephone Encounter (Signed)
His previous Rx was written for qd. He states the original prescriber was in [place], he was taking 2 a day, when Alycia Rossetti took over this Rx was written for 1 day, but pharmacy called and this was corrected, this was in January. Have reviewed chart and indeed it should be 2 a day. Have corrected.  To you FYI

## 2012-06-29 ENCOUNTER — Encounter (HOSPITAL_COMMUNITY): Payer: Self-pay | Admitting: Emergency Medicine

## 2012-06-29 ENCOUNTER — Emergency Department (HOSPITAL_COMMUNITY): Payer: BC Managed Care – PPO

## 2012-06-29 ENCOUNTER — Emergency Department (HOSPITAL_COMMUNITY)
Admission: EM | Admit: 2012-06-29 | Discharge: 2012-06-29 | Disposition: A | Discharge status: Home / Self Care | Payer: BC Managed Care – PPO | Attending: Emergency Medicine | Admitting: Emergency Medicine

## 2012-06-29 DIAGNOSIS — F17200 Nicotine dependence, unspecified, uncomplicated: Secondary | ICD-10-CM | POA: Insufficient documentation

## 2012-06-29 DIAGNOSIS — R000 Tachycardia, unspecified: Secondary | ICD-10-CM

## 2012-06-29 DIAGNOSIS — I959 Hypotension, unspecified: Secondary | ICD-10-CM | POA: Insufficient documentation

## 2012-06-29 DIAGNOSIS — Z79899 Other long term (current) drug therapy: Secondary | ICD-10-CM | POA: Insufficient documentation

## 2012-06-29 DIAGNOSIS — D72829 Elevated white blood cell count, unspecified: Secondary | ICD-10-CM

## 2012-06-29 DIAGNOSIS — I10 Essential (primary) hypertension: Secondary | ICD-10-CM | POA: Insufficient documentation

## 2012-06-29 DIAGNOSIS — Z7982 Long term (current) use of aspirin: Secondary | ICD-10-CM | POA: Insufficient documentation

## 2012-06-29 LAB — BASIC METABOLIC PANEL
CO2: 17 mEq/L — ABNORMAL LOW (ref 19–32)
Calcium: 9.8 mg/dL (ref 8.4–10.5)
Chloride: 99 mEq/L (ref 96–112)
Potassium: 4.3 mEq/L (ref 3.5–5.1)
Sodium: 133 mEq/L — ABNORMAL LOW (ref 135–145)

## 2012-06-29 LAB — CBC WITH DIFFERENTIAL/PLATELET
Basophils Absolute: 0.1 10*3/uL (ref 0.0–0.1)
Lymphocytes Relative: 25 % (ref 12–46)
Neutro Abs: 10.5 10*3/uL — ABNORMAL HIGH (ref 1.7–7.7)
Neutrophils Relative %: 68 % (ref 43–77)
Platelets: 276 10*3/uL (ref 150–400)
RBC: 4.07 MIL/uL — ABNORMAL LOW (ref 4.22–5.81)
RDW: 12.6 % (ref 11.5–15.5)
WBC: 15.6 10*3/uL — ABNORMAL HIGH (ref 4.0–10.5)

## 2012-06-29 LAB — URINALYSIS, ROUTINE W REFLEX MICROSCOPIC
Leukocytes, UA: NEGATIVE
Protein, ur: NEGATIVE mg/dL
Specific Gravity, Urine: 1.02 (ref 1.005–1.030)
Urobilinogen, UA: 0.2 mg/dL (ref 0.0–1.0)

## 2012-06-29 NOTE — ED Provider Notes (Signed)
See prior note   Sanuel Ladnier L Arsen Mangione, MD 06/29/12 2100 

## 2012-06-29 NOTE — ED Notes (Signed)
Pt reports feeling weak and dizzy while at home, denies n/v/d. Denies complaints of pain. Reports checking his BP at home and reports 82/47 while at home. Pt asymptomatic while in ER. Denies dizziness or weakness with ambulation.

## 2012-06-29 NOTE — ED Provider Notes (Addendum)
Patient reports yesterday when he said after sitting for while he felt dizzy like he was going to pass out. This happened twice yesterday and then this morning. He denies any nausea of vomiting or diarrhea. He also reports he has a baseline tachycardia and using has an elevated WBC count.  Patient is alert and cooperative and appears to be in no distress at this time. We discussed drinking Gatorade tonight and tomorrow and see how she feels.  Medical screening examination/treatment/procedure(s) were conducted as a shared visit with non-physician practitioner(s) and myself.  I personally evaluated the patient during the encounter  Devoria Albe, MD, Franz Dell, MD 06/29/12 Berton Bon  Ward Givens, MD 06/29/12 2059

## 2012-06-29 NOTE — ED Notes (Signed)
Pt complains of weakness and dizziness x 2 days. Pt states " I took my blood pressure at home and it was low"

## 2012-06-29 NOTE — ED Provider Notes (Signed)
History     CSN: [id number]  Arrival date & time 06/29/12  1204   First MD Initiated Contact with Patient 06/29/12 1545      Chief Complaint  Patient presents with  . Weakness    (Consider location/radiation/quality/duration/timing/severity/associated sxs/prior treatment) HPI Comments: 27 y.o. Female with PMHx of leukocytosis, HTN, and tachycardia presents complaining of an episode of hypotension once yesterday and earlier today. Pt states he notes it when he has been sitting for a long time and when he went to stand up, he felt a little lightheaded, so checked his blood pressure on an at-home monitor. Reading was 82/47. Pt admits overall feeling of weakness. Denies chest pain, shortness of breath, abdominal pain, nausea, vomiting, headaches, photophobia, cough, diaphoresis.  Pt has been on Estradiol and ASA since August 2013 for planned transgender operation August 2014.   Vitals in triage showed a BP of 101/62.  Patient is a 27 y.o. female presenting with weakness.  Weakness Associated symptoms include weakness. Pertinent negatives include no chest pain, diaphoresis, fever, headaches, nausea, neck pain, numbness, rash or vomiting.    Past Medical History  Diagnosis Date  . Counseling for hormone replacement therapy     started replacement therapy on 10/21/11  . Hypertension     Past Surgical History  Procedure Laterality Date  . Tonsillectomy      No family history on file.  History  Substance Use Topics  . Smoking status: Current Every Day Smoker -- 0.50 packs/day    Last Attempt to Quit: 12/05/2010  . Smokeless tobacco: Never Used  . Alcohol Use: 1.0 oz/week    2 drink(s) per week     Comment: occasionally      Review of Systems  Constitutional: Negative for fever and diaphoresis.  HENT: Negative for neck pain and neck stiffness.   Eyes: Negative for visual disturbance.  Respiratory: Negative for apnea, chest tightness and shortness of breath.   Cardiovascular:  Negative for chest pain and palpitations.  Gastrointestinal: Negative for nausea, vomiting, diarrhea and constipation.  Genitourinary: Negative for dysuria and hematuria.  Musculoskeletal: Negative for gait problem.  Skin: Negative for rash.  Neurological: Positive for weakness. Negative for dizziness, light-headedness, numbness and headaches.    Allergies  Review of patient's allergies indicates no known allergies.  Home Medications   Current Outpatient Rx  Name  Route  Sig  Dispense  Refill  . Ascorbic Acid (VITAMIN C) 1000 MG tablet   Oral   Take 1,000 mg by mouth daily.         Marland Kitchen aspirin EC 81 MG tablet   Oral   Take 81 mg by mouth daily.         . Calcium Carbonate-Vit D-Min (CALCIUM 1200 PO)   Oral   Take 1,200 mg by mouth daily.         . cholecalciferol (VITAMIN D) 1000 UNITS tablet   Oral   Take 1,000 Units by mouth daily.         . Cyanocobalamin (VITAMIN B 12 PO)   Oral   Take 1,000 mcg by mouth daily.         Marland Kitchen estradiol (ESTRACE) 2 MG tablet   Oral   Take 2 mg by mouth 2 (two) times daily.         . Flaxseed, Linseed, (FLAXSEED OIL) 1000 MG CAPS   Oral   Take 1,000 mg by mouth daily.         Marland Kitchen lisinopril (PRINIVIL,ZESTRIL) 20  MG tablet   Oral   Take 1 tablet (20 mg total) by mouth daily.   90 tablet   2   . metoprolol (LOPRESSOR) 50 MG tablet   Oral   Take 1 tablet (50 mg total) by mouth 2 (two) times daily.   180 tablet   0   . Multiple Vitamins-Minerals (MULTIVITAMIN WITH MINERALS) tablet   Oral   Take 1 tablet by mouth daily.         Marland Kitchen spironolactone-hydrochlorothiazide (ALDACTAZIDE) 25-25 MG per tablet   Oral   Take 2 tablets by mouth daily.   180 tablet   2   . medroxyPROGESTERone (DEPO-PROVERA) 400 MG/ML SUSP   Intramuscular   Inject 200 mg into the muscle every 3 (three) months.           BP 91/52  Pulse 107  Temp(Src) 98.8 F (37.1 C) (Oral)  Resp 16  Wt 161 lb (73.029 kg)  BMI 26 kg/m2  SpO2  99%  Physical Exam  Nursing note and vitals reviewed. Constitutional: He is oriented to person, place, and time. He appears well-developed and well-nourished. No distress.  HENT:  Head: Normocephalic and atraumatic.  Eyes: Conjunctivae and EOM are normal.  Neck: Normal range of motion. Neck supple.  No meningeal signs  Cardiovascular: Normal rate, regular rhythm and normal heart sounds.  Exam reveals no gallop and no friction rub.   No murmur heard. Tachycardic to 100s  Pulmonary/Chest: Effort normal and breath sounds normal. No respiratory distress. He has no wheezes. He has no rales. He exhibits no tenderness.  Abdominal: Soft. Bowel sounds are normal. He exhibits no distension. There is no tenderness. There is no rebound and no guarding.  Musculoskeletal: Normal range of motion. He exhibits no edema and no tenderness.  Normal strength in upper and lower extremities bilaterally including dorsiflexion and plantar flexion, strong and equal grip strength  Neurological: He is alert and oriented to person, place, and time. No cranial nerve deficit.  Speech is clear and goal oriented, follows commands Sensation normal to light touch and two point discrimination Moves extremities without ataxia, coordination intact Normal gait and balance  Skin: Skin is warm and dry. He is not diaphoretic. No erythema.  Psychiatric: He has a normal mood and affect.    ED Course  Procedures (including critical care time)  Labs Reviewed  CBC WITH DIFFERENTIAL - Abnormal; Notable for the following:    WBC 15.6 (*)    RBC 4.07 (*)    Hemoglobin 12.8 (*)    HCT 36.6 (*)    Neutro Abs 10.5 (*)    All other components within normal limits  BASIC METABOLIC PANEL - Abnormal; Notable for the following:    Sodium 133 (*)    CO2 17 (*)    BUN 28 (*)    All other components within normal limits  URINALYSIS, ROUTINE W REFLEX MICROSCOPIC   Dg Chest 2 View  06/29/2012  *RADIOLOGY REPORT*  Clinical Data:  Dizziness.  Hypertension.  CHEST - 2 VIEW  Comparison: 03/21/2011  Findings: Heart size is normal.  Mediastinal shadows are normal. Lungs are clear.  No effusions.  No bony abnormalities.  IMPRESSION: Normal chest   Original Report Authenticated By: Paulina Fusi, M.D.     Date: 06/29/2012  Rate: 101  Rhythm: sinus rhythm  QRS Axis: normal  Intervals: normal  ST/T Wave abnormalities: normal  Conduction Disutrbances: none  Narrative Interpretation: Normal EKG  Old EKG Reviewed: 10/24/2011 shows NSR, HR  85   1. Hypotension   2. Tachycardia   3. Leukocytosis       MDM  Vitals on re-check showed BP of 91/52. Pt asymptomatic, but will start IVF. CXR negative. EKG shows NSR along with persistent tachycardia. Discussed persistent tachycardia with pt who states he regularly has a fast heart rate. This is noted in previously reviewed medical notes. Pt also seen for leukocytosis back in November. As per reviewed medical notes, pt was re-checked, results were consistent, pt remained asymptomatic, at which point provided noted that leukocytosis was unconcerning.     7:08 PM Orthostatic VS show: Laying: 146/67 Sitting: 141/76 Standing: 155/76  Pt is asymptomatic at this time and there does not appear to be any evidence of an acute emergency medical condition and the patient appears stable for discharge with appropriate outpatient follow up. Diagnosis was discussed with patient who verbalizes understanding and is agreeable to discharge. Pt case discussed with Dr. Lynelle Doctor who agrees with my plan.       Glade Nurse, PA-C 06/29/12 2036

## 2012-08-07 ENCOUNTER — Other Ambulatory Visit: Payer: Self-pay

## 2012-08-07 DIAGNOSIS — I10 Essential (primary) hypertension: Secondary | ICD-10-CM

## 2012-08-07 MED ORDER — SPIRONOLACTONE-HCTZ 25-25 MG PO TABS: 2.0000 | ORAL_TABLET | Freq: Every day | ORAL | Status: DC

## 2013-01-13 ENCOUNTER — Other Ambulatory Visit: Payer: Self-pay | Admitting: Physician Assistant

## 2013-01-22 ENCOUNTER — Ambulatory Visit (INDEPENDENT_AMBULATORY_CARE_PROVIDER_SITE_OTHER): Payer: BC Managed Care – PPO | Admitting: Physician Assistant

## 2013-01-22 VITALS — BP 96/62 | HR 118 | Temp 99.5°F | Resp 20 | Ht 66.0 in | Wt 158.6 lb

## 2013-01-22 DIAGNOSIS — I10 Essential (primary) hypertension: Secondary | ICD-10-CM

## 2013-01-22 DIAGNOSIS — Z79899 Other long term (current) drug therapy: Secondary | ICD-10-CM

## 2013-01-22 DIAGNOSIS — K219 Gastro-esophageal reflux disease without esophagitis: Secondary | ICD-10-CM

## 2013-01-22 DIAGNOSIS — F17200 Nicotine dependence, unspecified, uncomplicated: Secondary | ICD-10-CM

## 2013-01-22 MED ORDER — METOPROLOL TARTRATE 50 MG PO TABS: 50.0000 mg | ORAL_TABLET | Freq: Two times a day (BID) | ORAL | Status: DC

## 2013-01-22 MED ORDER — LISINOPRIL 10 MG PO TABS: 10.0000 mg | ORAL_TABLET | Freq: Every day | ORAL | Status: DC

## 2013-01-22 MED ORDER — BUPROPION HCL ER (SMOKING DET) 150 MG PO TB12: 150.0000 mg | ORAL_TABLET | Freq: Every day | ORAL | Status: DC

## 2013-01-22 MED ORDER — OMEPRAZOLE 20 MG PO CPDR: 20.0000 mg | DELAYED_RELEASE_CAPSULE | Freq: Every day | ORAL | Status: DC

## 2013-01-22 MED ORDER — SPIRONOLACTONE-HCTZ 25-25 MG PO TABS: 1.0000 | ORAL_TABLET | Freq: Every day | ORAL | Status: DC

## 2013-01-22 NOTE — Patient Instructions (Signed)
I have decreased your Aldactazide by 1 tab daily. I have cut your lisinopril to 10 mg daily. Your metoprolol remains the same. We will start you on Zyban twice daily for smoking cessation. You can continue smoking for 5-7 days. You can supplement with nicotine gum. In one month call me with an update about your smoking and your blood pressures.   Hypertension As your heart beats, it forces blood through your arteries. This force is your blood pressure. If the pressure is too high, it is called hypertension (HTN) or high blood pressure. HTN is dangerous because you may have it and not know it. High blood pressure may mean that your heart has to work harder to pump blood. Your arteries may be narrow or stiff. The extra work puts you at risk for heart disease, stroke, and other problems.  Blood pressure consists of two numbers, a higher number over a lower, 110/72, for example. It is stated as "110 over 72." The ideal is below 120 for the top number (systolic) and under 80 for the bottom (diastolic). Write down your blood pressure today. You should pay close attention to your blood pressure if you have certain conditions such as:  Heart failure.  Prior heart attack.  Diabetes  Chronic kidney disease.  Prior stroke.  Multiple risk factors for heart disease. To see if you have HTN, your blood pressure should be measured while you are seated with your arm held at the level of the heart. It should be measured at least twice. A one-time elevated blood pressure reading (especially in the Emergency Department) does not mean that you need treatment. There may be conditions in which the blood pressure is different between your right and left arms. It is important to see your caregiver soon for a recheck. Most people have essential hypertension which means that there is not a specific cause. This type of high blood pressure may be lowered by changing lifestyle factors such as:  Stress.  Smoking.  Lack of  exercise.  Excessive weight.  Drug/tobacco/alcohol use.  Eating less salt. Most people do not have symptoms from high blood pressure until it has caused damage to the body. Effective treatment can often prevent, delay or reduce that damage. TREATMENT  When a cause has been identified, treatment for high blood pressure is directed at the cause. There are a large number of medications to treat HTN. These fall into several categories, and your caregiver will help you select the medicines that are best for you. Medications may have side effects. You should review side effects with your caregiver. If your blood pressure stays high after you have made lifestyle changes or started on medicines,   Your medication(s) may need to be changed.  Other problems may need to be addressed.  Be certain you understand your prescriptions, and know how and when to take your medicine.  Be sure to follow up with your caregiver within the time frame advised (usually within two weeks) to have your blood pressure rechecked and to review your medications.  If you are taking more than one medicine to lower your blood pressure, make sure you know how and at what times they should be taken. Taking two medicines at the same time can result in blood pressure that is too low. SEEK IMMEDIATE MEDICAL CARE IF:  You develop a severe headache, blurred or changing vision, or confusion.  You have unusual weakness or numbness, or a faint feeling.  You have severe chest or abdominal  pain, vomiting, or breathing problems. MAKE SURE YOU:   Understand these instructions.  Will watch your condition.  Will get help right away if you are not doing well or get worse. Document Released: 02/18/2005 Document Revised: 05/13/2011 Document Reviewed: 10/09/2007 [facility] Patient Information 2014 [place], [place].

## 2013-01-22 NOTE — Progress Notes (Signed)
Patient ID: Yvonne Harrell MRN: [id number], DOB: 02-08-1986, 27 y.o. Date of Encounter: 01/22/2013, 5:13 PM  Primary Physician: No primary provider on file.  Chief Complaint: Medication refill  HPI: 27 y.o. female with history below presents for medication refill. Patient has under gone a name change since our lst OV. Changing from Yvonne Harrell to Yvonne Harrell. He is currently is the process of female to female gender change. This is followed by Dr. Ruby Cola. Last OV was today. Had estrogen and testosterone checked today. States that he is not noticing any "changes."  He does state since his last OV his BP has been running considerably low. He was seen in the ED for hypotension with a BP of 91/52. States that he has been feeling dizzy sometimes when he stands up. He has also been feeling more fatigued. His blood pressure at home has been running in the 90's/60's and sometimes the systolic will be in the 80's he states. His current antihypertensive regimen is Aldactazide 25/25 bid, lisinopril 20 daily, and metoprolol 50 bid. No chest pain.     He continues to use tobacco as he did not succeed with Chantix. He states that he needs to quite smoking prior to moving forward with his gender change per the MD policy. He is interested in trying a different medication.      Past Medical History  Diagnosis Date  . Counseling for hormone replacement therapy     started replacement therapy on 10/21/11  . Hypertension      Home Meds: Prior to Admission medications   Medication Sig Start Date End Date Taking? Authorizing Provider  aspirin EC 81 MG tablet Take 81 mg by mouth daily.   Yes Historical Provider, MD  estradiol (ESTRACE) 2 MG tablet Take 2 mg by mouth 2 (two) times daily.   Yes Historical Provider, MD  medroxyPROGESTERone (DEPO-PROVERA) 400 MG/ML SUSP Inject 200 mg into the muscle every 3 (three) months.   Yes Historical Provider, MD  metoprolol (LOPRESSOR) 50 MG tablet Take 1 tablet (50 mg  total) by mouth 2 (two) times daily. 01/22/13  Yes Willies Laviolette M Rhianon Zabawa, PA-C  Ascorbic Acid (VITAMIN C) 1000 MG tablet Take 1,000 mg by mouth daily.    Historical Provider, MD  buPROPion (ZYBAN) 150 MG 12 hr tablet Take 1 tablet (150 mg total) by mouth daily. 01/22/13   Sondra Barges, PA-C  Calcium Carbonate-Vit D-Min (CALCIUM 1200 PO) Take 1,200 mg by mouth daily.    Historical Provider, MD  cholecalciferol (VITAMIN D) 1000 UNITS tablet Take 1,000 Units by mouth daily.    Historical Provider, MD  Cyanocobalamin (VITAMIN B 12 PO) Take 1,000 mcg by mouth daily.    Historical Provider, MD  Flaxseed, Linseed, (FLAXSEED OIL) 1000 MG CAPS Take 1,000 mg by mouth daily.    Historical Provider, MD  lisinopril (PRINIVIL,ZESTRIL) 10 MG tablet Take 1 tablet (10 mg total) by mouth daily. 01/22/13   Sondra Barges, PA-C  Multiple Vitamins-Minerals (MULTIVITAMIN WITH MINERALS) tablet Take 1 tablet by mouth daily.    Historical Provider, MD  omeprazole (PRILOSEC) 20 MG capsule Take 1 capsule (20 mg total) by mouth daily. 01/22/13   Sondra Barges, PA-C  spironolactone-hydrochlorothiazide (ALDACTAZIDE) 25-25 MG per tablet Take 1 tablet by mouth daily. 01/22/13   Sondra Barges, PA-C    Allergies: No Known Allergies  History   Social History  . Marital Status: Married    Spouse Name: N/A    Number of Children: N/A  .  Years of Education: N/A   Occupational History  . Not on file.   Social History Main Topics  . Smoking status: Current Every Day Smoker -- 0.50 packs/day    Last Attempt to Quit: 12/05/2010  . Smokeless tobacco: Never Used  . Alcohol Use: 1.0 oz/week    2 drink(s) per week     Comment: occasionally  . Drug Use: No  . Sexual Activity: Yes    Birth Control/ Protection: None   Other Topics Concern  . Not on file   Social History Narrative  . No narrative on file     Review of Systems: Constitutional: positive for fatigue. negative for chills or fever  HEENT: negative for vision changes or hearing  loss Cardiovascular: negative for chest pain or palpitations Respiratory: negative for wheezing, shortness of breath, or cough Abdominal: negative for abdominal pain, nausea, vomiting, diarrhea, or constipation Dermatological: negative for rash Neurologic: positive for dizziness. negative for headache or syncope   Physical Exam: Blood pressure 96/62, pulse 118, temperature 99.5 F (37.5 C), temperature source Oral, resp. rate 20, height 5\' 6"  (1.676 m), weight 158 lb 9.6 oz (71.94 kg), SpO2 98.00%., Body mass index is 25.61 kg/(m^2). General: Well developed, well nourished, in no acute distress. Head: Normocephalic, atraumatic, eyes without discharge, sclera non-icteric, nares are without discharge.   Neck: Supple. Full ROM.  Lungs: Clear bilaterally to auscultation without wheezes, rales, or rhonchi. Breathing is unlabored. Heart: RRR with S1 S2. No murmurs, rubs, or gallops appreciated. Msk:  Strength and tone normal for age. Extremities/Skin: Warm and dry. No clubbing or cyanosis. No edema. No rashes or suspicious lesions. Neuro: Alert and oriented X 3. Moves all extremities spontaneously. Gait is normal. CNII-XII grossly in tact. Psych:  Responds to questions appropriately with a normal affect.     ASSESSMENT AND PLAN:  27 y.o. 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 Ruby Cola -Last OV with Dr. Ruby Cola was today, follow up to be determined from labs drawn today -Risks of HRT and tobacco use discussed including increased risk of blood clots -Healthy diet  2) Hypotension -Decrease Aldactazide 25/25 mg to 1 po daily #90 RF 1 -Decrease lisinopril to 10 mg 1 po daily #90 RF 1 -Metoprolol 50 mg 1 po bid #180 RF 1 -Monitor BP at home, call with update in 1 month, sooner if  worse or RTC -Tachycardia compensatory   3) Tobacco use -He cannot move forward with his surgical procedures while smoking  -He did not succeed with Chantix -Trial of Zyban 150 mg 1 po bid #180 RF 1 -Supplement with nicotine gum -Declines counseling at this time -Call with update in 1 month, consider titration of Zyban at that time    Signed, Eula Listen, PA-C [facility] [place], [place] [address] [phone] 01/22/2013 5:13 PM

## 2013-01-27 ENCOUNTER — Other Ambulatory Visit: Payer: Self-pay

## 2013-01-27 DIAGNOSIS — F17200 Nicotine dependence, unspecified, uncomplicated: Secondary | ICD-10-CM

## 2013-01-27 MED ORDER — BUPROPION HCL ER (SMOKING DET) 150 MG PO TB12: 150.0000 mg | ORAL_TABLET | Freq: Two times a day (BID) | ORAL | Status: DC

## 2013-01-27 NOTE — Telephone Encounter (Signed)
Exp scripts sent for clarification on bupropion since sig was for 1 QD and quantity is #180. According to OV notes, Alycia Rossetti wants pt taking this BID so I am sending in corrected Rx.

## 2013-01-30 ENCOUNTER — Telehealth: Payer: Self-pay

## 2013-01-30 NOTE — Telephone Encounter (Signed)
Error

## 2013-06-20 ENCOUNTER — Other Ambulatory Visit: Payer: Self-pay | Admitting: Physician Assistant

## 2013-08-27 ENCOUNTER — Ambulatory Visit (INDEPENDENT_AMBULATORY_CARE_PROVIDER_SITE_OTHER): Payer: BC Managed Care – PPO | Admitting: Family Medicine

## 2013-08-27 ENCOUNTER — Encounter: Payer: Self-pay | Admitting: Family Medicine

## 2013-08-27 VITALS — BP 122/86 | HR 107 | Temp 98.7°F | Ht 66.75 in | Wt 161.0 lb

## 2013-08-27 DIAGNOSIS — I10 Essential (primary) hypertension: Secondary | ICD-10-CM

## 2013-08-27 DIAGNOSIS — F649 Gender identity disorder, unspecified: Secondary | ICD-10-CM

## 2013-08-27 DIAGNOSIS — Z79890 Hormone replacement therapy (postmenopausal): Secondary | ICD-10-CM

## 2013-08-27 DIAGNOSIS — K219 Gastro-esophageal reflux disease without esophagitis: Secondary | ICD-10-CM

## 2013-08-27 DIAGNOSIS — F640 Transsexualism: Secondary | ICD-10-CM

## 2013-08-27 MED ORDER — METOPROLOL TARTRATE 50 MG PO TABS: 50.0000 mg | ORAL_TABLET | Freq: Two times a day (BID) | ORAL | Status: DC

## 2013-08-27 MED ORDER — SPIRONOLACTONE-HCTZ 25-25 MG PO TABS: 1.0000 | ORAL_TABLET | Freq: Every day | ORAL | Status: DC

## 2013-08-27 MED ORDER — LISINOPRIL 10 MG PO TABS: 10.0000 mg | ORAL_TABLET | Freq: Every day | ORAL | Status: DC

## 2013-08-27 MED ORDER — OMEPRAZOLE 20 MG PO CPDR: 20.0000 mg | DELAYED_RELEASE_CAPSULE | Freq: Every day | ORAL | Status: DC

## 2013-08-27 NOTE — Patient Instructions (Signed)
-  call and get quit coach  -pick quit date in next 2 months  -pick activity to replace physical habit of smoking  -get rid of all cigarettes and get gum and lozenges before quit date  -use nicotine gum or lozenge for cravings for 1 month then wean off, chew and park if you use the gum  -have Dr. Ruby ColaPittaway send us copies of basic labs when draws these

## 2013-08-27 NOTE — Progress Notes (Signed)
No chief complaint on file.   HPI:  Yvonne Harrell is here to establish care. New to getting a family doctor.  Last PCP and physical:  Has the following chronic problems and concerns today:  Patient Active Problem List   Diagnosis Date Noted  . Hypertension 02/04/2011   Gender Dysphoria, Hx of, in the process of gender reassignment with Dr. Ruby Harrell: -sees Dr. Ruby Harrell (for hormone therapy, put her on the spironolactone) -reports: gender reassignment scheduled for July 2016 with Dr. Alric Quanony Harrell in [place] -denies: any issues with taking the hormones, closely monitored by her reproductive endocrinologist -on asa for risks associated with the hormone therapy -strong FH htn, long hx of HTN -Yvonne MayShana Harrell - seeing  counselor in [place]  GERD: -takes prilosec -wants refill on this as cheaper then over the counter  Tobacco Use: -chantix worked before, but then restarted -smoking 5 cigarettes daily -motivated to try to quit again  HTN: -meds: lisinopril, spironolactone-hctz 25-25 , metoprolol 50 twice daily -reports: doing well on this regimen -denies: CP, SOB, palpitations, swelling - had labs in the end of Harrell - reports electrolytes asll ok at the time  Health Maintenance: -declined tetanus ROS: See pertinent positives and negatives per HPI.  Past Medical History  Diagnosis Date  . Counseling for hormone replacement therapy     started replacement therapy on 10/21/11  . Hypertension     Family History  Problem Relation Age of Onset  . Hypertension Father   . Hypertension Paternal Grandfather     History   Social History  . Marital Status: Married    Spouse Name: N/A    Number of Children: N/A  . Years of Education: N/A   Social History Main Topics  . Smoking status: Current Every Day Smoker -- 0.50 packs/day    Last Attempt to Quit: 12/05/2010  . Smokeless tobacco: Never Used  . Alcohol Use: 1.0 oz/week    2 drink(s) per week     Comment: occasionally  .  Drug Use: No  . Sexual Activity: Yes    Birth Control/ Protection: None   Other Topics Concern  . None   Social History Narrative   Work or School: Systems developeranalyst for time [employer]      Home Situation: lives with husband/partner. Gender dysphoria.      Spiritual Beliefs: none      Lifestyle: no regular exercise; diet is ok             Current outpatient prescriptions:aspirin EC 81 MG tablet, Take 81 mg by mouth daily., Disp: , Rfl: ;  cholecalciferol (VITAMIN D) 1000 UNITS tablet, Take 1,000 Units by mouth daily., Disp: , Rfl: ;  estradiol (ESTRACE) 2 MG tablet, Take 2 mg by mouth 2 (two) times daily., Disp: , Rfl: ;  lisinopril (PRINIVIL,ZESTRIL) 10 MG tablet, Take 1 tablet (10 mg total) by mouth daily., Disp: 90 tablet, Rfl: 1 medroxyPROGESTERone (DEPO-PROVERA) 400 MG/ML SUSP, Inject 200 mg into the muscle every 3 (three) months., Disp: , Rfl: ;  metoprolol (LOPRESSOR) 50 MG tablet, Take 1 tablet (50 mg total) by mouth 2 (two) times daily., Disp: 180 tablet, Rfl: 1;  Multiple Vitamins-Minerals (MULTIVITAMIN WITH MINERALS) tablet, Take 1 tablet by mouth daily., Disp: , Rfl:  omeprazole (PRILOSEC) 20 MG capsule, Take 1 capsule (20 mg total) by mouth daily., Disp: 90 capsule, Rfl: 1;  spironolactone-hydrochlorothiazide (ALDACTAZIDE) 25-25 MG per tablet, Take 1 tablet by mouth daily., Disp: 90 tablet, Rfl: 1  EXAM:  Filed Vitals:  08/27/13 0938  BP: 122/86  Pulse: 107  Temp: 98.7 F (37.1 C)    Body mass index is 25.42 kg/(m^2).  GENERAL: vitals reviewed and listed above, alert, oriented, appears well hydrated and in no acute distress  HEENT: atraumatic, conjunttiva clear, no obvious abnormalities on inspection of external nose and ears  NECK: no obvious masses on inspection  LUNGS: clear to auscultation bilaterally, no wheezes, rales or rhonchi, good air movement  CV: HRRR, no peripheral edema  MS: moves all extremities without noticeable abnormality  PSYCH: pleasant and  cooperative, no obvious depression or anxiety  ASSESSMENT AND PLAN:  Discussed the following assessment and plan:  Essential hypertension - Plan: lisinopril (PRINIVIL,ZESTRIL) 10 MG tablet, metoprolol (LOPRESSOR) 50 MG tablet, spironolactone-hydrochlorothiazide (ALDACTAZIDE) 25-25 MG per tablet  Gastroesophageal reflux disease, esophagitis presence not specified - Plan: omeprazole (PRILOSEC) 20 MG capsule  Hormone replacement therapy  Gender dysphoria  -We reviewed the PMH, PSH, FH, SH, Meds and Allergies. -We provided refills for any medications we will prescribe as needed. -We addressed current concerns per orders and patient instructions. -We have asked for records for pertinent exams, studies, vaccines and notes from previous providers. -We have advised patient to follow up per instructions below.   -Patient advised to return or notify a doctor immediately if symptoms worsen or persist or new concerns arise.  Patient Instructions  -call and get quit coach  -pick quit date in next 2 months  -pick activity to replace physical habit of smoking  -get rid of all cigarettes and get gum and lozenges before quit date  -use nicotine gum or lozenge for cravings for 1 month then wean off, chew and park if you use the gum  -have Dr. Ruby Harrell send us copies of basic labs when draws these     Yvonne Harrell, Yvonne ClientHANNAH R.

## 2013-08-27 NOTE — Progress Notes (Signed)
Pre visit review using our clinic review tool, if applicable. No additional management support is needed unless otherwise documented below in the visit note. 

## 2013-08-30 ENCOUNTER — Telehealth: Payer: Self-pay | Admitting: Family Medicine

## 2013-08-30 NOTE — Telephone Encounter (Signed)
Relevant patient education mailed to patient.  

## 2013-08-30 NOTE — Telephone Encounter (Signed)
Relevant patient education assigned to patient using Emmi. ° °

## 2013-09-01 HISTORY — PX: BREAST ENHANCEMENT SURGERY: SHX7

## 2013-09-04 ENCOUNTER — Ambulatory Visit (INDEPENDENT_AMBULATORY_CARE_PROVIDER_SITE_OTHER): Payer: BC Managed Care – PPO | Admitting: Family Medicine

## 2013-09-04 VITALS — BP 118/88 | HR 102 | Temp 99.2°F | Resp 20 | Ht 66.0 in | Wt 159.2 lb

## 2013-09-04 DIAGNOSIS — F640 Transsexualism: Secondary | ICD-10-CM

## 2013-09-04 DIAGNOSIS — Z789 Other specified health status: Secondary | ICD-10-CM

## 2013-09-04 DIAGNOSIS — Z01818 Encounter for other preprocedural examination: Secondary | ICD-10-CM

## 2013-09-04 DIAGNOSIS — I10 Essential (primary) hypertension: Secondary | ICD-10-CM

## 2013-09-04 DIAGNOSIS — D72829 Elevated white blood cell count, unspecified: Secondary | ICD-10-CM

## 2013-09-04 NOTE — Patient Instructions (Signed)
Preop medical clearance has been done. We are checking on the CBC to make sure your hemoglobin is good, and that the white blood count is not changing dramatically. You have run a high white blood count for the last couple of years. If it continues to climb you probably need to see a hematologist at some point for further assessment.  Your last labs showed an elevation of one of the kidney function tests last year, the BUN, which was 28. I will be checking to make sure that is normal before giving surgical approval. If you do not hear from us by Monday evening call and ask them to have me check your laboratory data for your surgery.

## 2013-09-04 NOTE — Progress Notes (Addendum)
Preop medical clearance:  History: 28 year old lady who is here for a preoperative medical evaluation. She is having and augmentation mammoplasty done this coming Wednesday. She needs a preoperative medical clearance done. She has a copy of her letter for her friends for this from Dr. Neale BurlyBerber which I've from her e-mail and pasted below.  She has no major acute medical complaints. She is transgender and getting the surgical changes done.   Past medical history: Surgeries: Tonsillectomy Medical illnesses: Hypertension. History of leukocytosis on a chronic basis. Allergies: None Regular medications: See active list in epic  Social history: Is married. 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 Does not do regular exercise.  Family history: Hypertension  Review of systems: Constitutional: Unremarkable HEENT: Unremarkable Restaurant: Unremarkable Cardiovascular: Unremarkable Gastrointestinal: Unremarkable Urinary: Unremarkable.Has not had an orchiectomy. Toprol: Unremarkable Neurologic: Unremarkable Dermatologic: Unremarkable Psychiatric: Unremarkable   Physical exam: Healthy-appearing individual in no acute distress. TMs normal. Eyes PERRLA. EOMs intact. Throat clear. Teeth good. Neck supple without nodes or thyromegaly. No carotid bruits. Chest clear to auscultation. Heart regular without murmurs. And soft without mass or tenderness. Genitorectal exam not done. Extremities unremarkable. Skin unremarkable.  Assessment: Preop medical clearance History of leukocytosis (15,000 last year, elevated the last 4 times she has had one checked here in epic) Hypertension transgender  Plan: The patient is cleared for surgery assuming that the CBC and blood chemistries are normal..  BUN was 28 one year ago, but chemistries were a little out of line and I'm not sure what was going on at that time. Has been running a elevated white count for fairly long period. There was a slight anemia last year, but I'm not  certain what would be heard well him since she still has her testicles and is on estrogen and progesterone.  Letter of referral from plastic surgery is copy below  Results for orders placed in visit on 09/04/13  CBC      Result Value Ref Range   WBC 14.3 (*) 4.0 - 10.5 K/uL   RBC 4.58  4.22 - 5.81 MIL/uL   Hemoglobin 15.0  13.0 - 17.0 g/dL   HCT 16.141.4  09.639.0 - 04.552.0 %   MCV 90.4  78.0 - 100.0 fL   MCH 32.8  26.0 - 34.0 pg   MCHC 36.2 (*) 30.0 - 36.0 g/dL   RDW 40.912.5  81.111.5 - 91.415.5 %   Platelets 277  150 - 400 K/uL  BASIC METABOLIC PANEL      Result Value Ref Range   Sodium 137  135 - 145 mEq/L   Potassium 4.0  3.5 - 5.3 mEq/L   Chloride 99  96 - 112 mEq/L   CO2 25  19 - 32 mEq/L   Glucose, Bld 93  70 - 99 mg/dL   BUN 11  6 - 23 mg/dL   Creat 7.820.82  9.560.50 - 2.131.35 mg/dL   Calcium 9.9  8.4 - 08.610.5 mg/dL   She has the chronic leukocytosis, etiology unclear. Renal function was fine today.   OK for surgery since this leukocytosis has not changed in the past year.

## 2013-09-05 LAB — CBC
HCT: 41.4 % (ref 39.0–52.0)
Hemoglobin: 15 g/dL (ref 13.0–17.0)
MCH: 32.8 pg (ref 26.0–34.0)
MCHC: 36.2 g/dL — AB (ref 30.0–36.0)
MCV: 90.4 fL (ref 78.0–100.0)
Platelets: 277 10*3/uL (ref 150–400)
RBC: 4.58 MIL/uL (ref 4.22–5.81)
RDW: 12.5 % (ref 11.5–15.5)
WBC: 14.3 10*3/uL — ABNORMAL HIGH (ref 4.0–10.5)

## 2013-09-05 LAB — BASIC METABOLIC PANEL
BUN: 11 mg/dL (ref 6–23)
CALCIUM: 9.9 mg/dL (ref 8.4–10.5)
CO2: 25 mEq/L (ref 19–32)
Chloride: 99 mEq/L (ref 96–112)
Creat: 0.82 mg/dL (ref 0.50–1.35)
GLUCOSE: 93 mg/dL (ref 70–99)
POTASSIUM: 4 meq/L (ref 3.5–5.3)
SODIUM: 137 meq/L (ref 135–145)

## 2013-09-05 NOTE — Progress Notes (Deleted)
   Subjective:    Patient ID: Yvonne Harrell, female    DOB: Jun 26, 1985, 28 y.o.   MRN: [id number]  HPI    Review of Systems     Objective:   Physical Exam        Assessment & Plan:

## 2013-11-29 ENCOUNTER — Emergency Department (HOSPITAL_COMMUNITY)
Admission: EM | Admit: 2013-11-29 | Discharge: 2013-11-29 | Disposition: A | Discharge status: Home / Self Care | Payer: BC Managed Care – PPO | Attending: Emergency Medicine | Admitting: Emergency Medicine

## 2013-11-29 ENCOUNTER — Encounter (HOSPITAL_COMMUNITY): Payer: Self-pay | Admitting: Emergency Medicine

## 2013-11-29 DIAGNOSIS — K089 Disorder of teeth and supporting structures, unspecified: Secondary | ICD-10-CM | POA: Diagnosis present

## 2013-11-29 DIAGNOSIS — F17200 Nicotine dependence, unspecified, uncomplicated: Secondary | ICD-10-CM | POA: Diagnosis not present

## 2013-11-29 DIAGNOSIS — Z79899 Other long term (current) drug therapy: Secondary | ICD-10-CM | POA: Insufficient documentation

## 2013-11-29 DIAGNOSIS — I10 Essential (primary) hypertension: Secondary | ICD-10-CM | POA: Diagnosis not present

## 2013-11-29 DIAGNOSIS — K115 Sialolithiasis: Secondary | ICD-10-CM | POA: Diagnosis not present

## 2013-11-29 DIAGNOSIS — K029 Dental caries, unspecified: Secondary | ICD-10-CM | POA: Diagnosis not present

## 2013-11-29 DIAGNOSIS — Z7982 Long term (current) use of aspirin: Secondary | ICD-10-CM | POA: Insufficient documentation

## 2013-11-29 MED ORDER — HYDROCODONE-ACETAMINOPHEN 7.5-325 MG/15ML PO SOLN: 10.0000 mL | Freq: Four times a day (QID) | ORAL | Status: DC | PRN

## 2013-11-29 MED ORDER — DEXAMETHASONE SODIUM PHOSPHATE 10 MG/ML IJ SOLN
10.0000 mg | Freq: Once | INTRAMUSCULAR | Status: AC
  Administered 2013-11-29: 10 mg via INTRAVENOUS
  Filled 2013-11-29: qty 1

## 2013-11-29 MED ORDER — KETOROLAC TROMETHAMINE 30 MG/ML IJ SOLN
30.0000 mg | Freq: Once | INTRAMUSCULAR | Status: AC
  Administered 2013-11-29: 30 mg via INTRAVENOUS
  Filled 2013-11-29: qty 1

## 2013-11-29 MED ORDER — CLINDAMYCIN HCL 300 MG PO CAPS
300.0000 mg | ORAL_CAPSULE | Freq: Once | ORAL | Status: AC
  Administered 2013-11-29: 300 mg via ORAL
  Filled 2013-11-29: qty 1

## 2013-11-29 MED ORDER — CLINDAMYCIN HCL 300 MG PO CAPS: 300.0000 mg | ORAL_CAPSULE | Freq: Four times a day (QID) | ORAL | Status: DC

## 2013-11-29 NOTE — ED Provider Notes (Signed)
CSN: [id number]     Arrival date & time 11/29/13  4098 History   First MD Initiated Contact with Patient 11/29/13 0430     Chief Complaint  Patient presents with  . Dental Pain    left, upper and lower     (Consider location/radiation/quality/duration/timing/severity/associated sxs/prior Treatment) Patient is a 28 y.o. female presenting with tooth pain. The history is provided by the patient. No language interpreter was used.  Dental Pain Location:  Lower Lower teeth location:  17/LL 3rd molar (swelling in submandibular salivary gland as well) Quality:  Dull Severity:  Severe Onset quality:  Sudden Duration:  4 hours Timing:  Constant Progression:  Unchanged Chronicity:  New Context: not dental fracture   Previous work-up:  Dental exam Relieved by:  Nothing Worsened by:  Nothing tried Ineffective treatments:  None tried Associated symptoms: no difficulty swallowing, no drooling, no facial swelling, no fever, no gum swelling, no neck swelling and no oral bleeding   Risk factors: smoking   Risk factors: no cancer   Risk factors comment:  Drank 4 shots this evening Swelling was not there prior to going to bed at 11 pm, and wife was not aware there were any issues.    Past Medical History  Diagnosis Date  . Counseling for hormone replacement therapy     started replacement therapy on 10/21/11  . Hypertension    Past Surgical History  Procedure Laterality Date  . Tonsillectomy    . Breast enhancement surgery  09/2013   Family History  Problem Relation Age of Onset  . Hypertension Father   . Hypertension Paternal Grandfather    History  Substance Use Topics  . Smoking status: Current Every Day Smoker -- 0.50 packs/day    Types: Cigarettes  . Smokeless tobacco: Never Used  . Alcohol Use: 1.0 oz/week    2 drink(s) per week     Comment: occasionally    Review of Systems  Constitutional: Negative for fever.  HENT: Negative for drooling and facial swelling.   All other  systems reviewed and are negative.     Allergies  Review of patient's allergies indicates no known allergies.  Home Medications   Prior to Admission medications   Medication Sig Start Date End Date Taking? Authorizing Provider  aspirin EC 81 MG tablet Take 81 mg by mouth daily.   Yes Historical Provider, MD  estradiol (ESTRACE) 2 MG tablet Take 2 mg by mouth 3 (three) times daily.    Yes Historical Provider, MD  HYDROcodone-acetaminophen (NORCO/VICODIN) 5-325 MG per tablet Take 0.5 tablets by mouth every 6 (six) hours as needed for moderate pain.   Yes Historical Provider, MD  lisinopril (PRINIVIL,ZESTRIL) 10 MG tablet Take 1 tablet (10 mg total) by mouth daily. 08/27/13  Yes Terressa Koyanagi, DO  metoprolol (LOPRESSOR) 50 MG tablet Take 1 tablet (50 mg total) by mouth 2 (two) times daily. 08/27/13  Yes Terressa Koyanagi, DO  Multiple Vitamins-Minerals (MULTIVITAMIN WITH MINERALS) tablet Take 1 tablet by mouth daily.   Yes Historical Provider, MD  omeprazole (PRILOSEC) 20 MG capsule Take 1 capsule (20 mg total) by mouth daily. 08/27/13  Yes Terressa Koyanagi, DO  spironolactone-hydrochlorothiazide (ALDACTAZIDE) 25-25 MG per tablet Take 1 tablet by mouth daily. 08/27/13  Yes Terressa Koyanagi, DO  clindamycin (CLEOCIN) 300 MG capsule Take 1 capsule (300 mg total) by mouth 4 (four) times daily. X 7 days 11/29/13   Ciela Mahajan K Vennessa Affinito-Rasch, MD  HYDROcodone-acetaminophen (HYCET) 7.5-325 mg/15 ml solution  Take 10 mLs by mouth every 6 (six) hours as needed for moderate pain. 11/29/13 11/29/14  Jessenia Filippone K Janara Klett-Rasch, MD  medroxyPROGESTERone (DEPO-PROVERA) 400 MG/ML SUSP Inject 200 mg into the muscle every 3 (three) months.    Historical Provider, MD   BP 115/81  Pulse 115  Temp(Src) 98 F (36.7 C) (Oral)  Resp 18  SpO2 100% Physical Exam  Constitutional: He is oriented to person, place, and time. He appears well-developed and well-nourished. No distress.  HENT:  Head: Normocephalic and atraumatic.  Mouth/Throat:  Oropharynx is clear and moist. No oropharyngeal exudate.  Wouldn't open mouth then opened mouth all the way talking and for uvula exam.  No swelling of the lips tongue or uvula  Wisdom tooth left lower is filled without abscess  Eyes: Conjunctivae and EOM are normal. Pupils are equal, round, and reactive to light.  Neck: Normal range of motion. Neck supple. No tracheal deviation present.  Tender swelling submandibular salivary gland  Cardiovascular: Normal rate, regular rhythm and intact distal pulses.   Pulmonary/Chest: Effort normal and breath sounds normal. No stridor. He has no wheezes. He has no rales.  Abdominal: Soft. Bowel sounds are normal. There is no tenderness. There is no rebound and no guarding.  Musculoskeletal: Normal range of motion.  Lymphadenopathy:    He has no cervical adenopathy.  Neurological: He is alert and oriented to person, place, and time.  Skin: Skin is warm and dry.  Psychiatric: He has a normal mood and affect.    ED Course  Procedures (including critical care time) Labs Review Labs Reviewed - No data to display  Imaging Review No results found.   EKG Interpretation None      MDM   Final diagnoses:  Salivary stone  Dental caries    Lortab elixir and clindamycin and close follow up with dentistry.    Return for change in voice shortness of breath swelling of the lips or tongue or floor of the mouth swelling of the neck or any concerns.     Patient states not improved but swelling of submandibular gland is markedly improved post lemon Drop (used for citric acid to draw out swelling from stone.  Will treat for both salivary stone and dental caries and refer to dentistry    Angelyna Henderson K Tel Hevia-Rasch, MD 11/29/13 [phone]

## 2013-11-29 NOTE — ED Notes (Signed)
Patient c/o left sided upper and lower dental pain. Patient states both her upper and lower wisdom teeth are cracked. Patient c/o swelling to face and gums, and states she feels like she has swelling in her throat. Patient had cold like symptoms prior to the onset of the dental pain.

## 2013-11-29 NOTE — Discharge Instructions (Signed)
Dental Care and Dentist Visits  Dental care supports good overall health. Regular dental visits can also help you avoid dental pain, bleeding, infection, and other more serious health problems in the future. It is important to keep the mouth healthy because diseases in the teeth, gums, and other oral tissues can spread to other areas of the body. Some problems, such as diabetes, heart disease, and pre-term labor have been associated with poor oral health.   See your dentist every 6 months. If you experience emergency problems such as a toothache or broken tooth, go to the dentist right away. If you see your dentist regularly, you may catch problems early. It is easier to be treated for problems in the early stages.   WHAT TO EXPECT AT A DENTIST VISIT   Your dentist will look for many common oral health problems and recommend proper treatment. At your regular dental visit, you can expect:  · Gentle cleaning of the teeth and gums. This includes scraping and polishing. This helps to remove the sticky substance around the teeth and gums (plaque). Plaque forms in the mouth shortly after eating. Over time, plaque hardens on the teeth as tartar. If tartar is not removed regularly, it can cause problems. Cleaning also helps remove stains.  · Periodic X-rays. These pictures of the teeth and supporting bone will help your dentist assess the health of your teeth.  · Periodic fluoride treatments. Fluoride is a natural mineral shown to help strengthen teeth. Fluoride treatment involves applying a fluoride gel or varnish to the teeth. It is most commonly done in children.  · Examination of the mouth, tongue, jaws, teeth, and gums to look for any oral health problems, such as:  ¨ Cavities (dental caries). This is decay on the tooth caused by plaque, sugar, and acid in the mouth. It is best to catch a cavity when it is small.  ¨ Inflammation of the gums caused by plaque buildup (gingivitis).  ¨ Problems with the mouth or malformed  or misaligned teeth.  ¨ Oral cancer or other diseases of the soft tissues or jaws.   KEEP YOUR TEETH AND GUMS HEALTHY  For healthy teeth and gums, follow these general guidelines as well as your dentist's specific advice:  · Have your teeth professionally cleaned at the dentist every 6 months.  · Brush twice daily with a fluoride toothpaste.  · Floss your teeth daily.   · Ask your dentist if you need fluoride supplements, treatments, or fluoride toothpaste.  · Eat a healthy diet. Reduce foods and drinks with added sugar.  · Avoid smoking.  TREATMENT FOR ORAL HEALTH PROBLEMS  If you have oral health problems, treatment varies depending on the conditions present in your teeth and gums.  · Your caregiver will most likely recommend good oral hygiene at each visit.  · For cavities, gingivitis, or other oral health disease, your caregiver will perform a procedure to treat the problem. This is typically done at a separate appointment. Sometimes your caregiver will refer you to another dental specialist for specific tooth problems or for surgery.  SEEK IMMEDIATE DENTAL CARE IF:  · You have pain, bleeding, or soreness in the gum, tooth, jaw, or mouth area.  · A permanent tooth becomes loose or separated from the gum socket.  · You experience a blow or injury to the mouth or jaw area.  Document Released: 10/31/2010 Document Revised: 05/13/2011 Document Reviewed: 10/31/2010  [employer]® Patient Information ©2015 [employer]. This information is not intended to replace advice   given to you by your health care provider. Make sure you discuss any questions you have with your health care provider.

## 2013-11-29 NOTE — ED Notes (Signed)
Patient is alert and oriented x3.  She was given DC instructions and follow up visit instructions.  Patient gave verbal understanding. She was DC ambulatory under her own power to home.  V/S stable.  He was not showing any signs of distress on DC 

## 2013-12-22 IMAGING — CT CT ABD-PELV W/ CM
2 of 4 series · 14 of 32 positions shown, 19 images · IV contrast (30CC OMNI 300 & [ID] OMNI 300)
Comparison: CT abdomen and pelvis 09/11/2008.

CLINICAL DATA: Right upper quadrant and infraumbilical pain.
Symptoms for 1 year.  Micro hematuria.

CT ABDOMEN AND PELVIS WITH CONTRAST
TECHNIQUE: Multidetector CT imaging of the abdomen and pelvis was
performed following the standard protocol during bolus
administration of intravenous contrast.
Contrast: 100mL OMNIPAQUE IOHEXOL 300 MG/ML IV SOLN

[Series 2: abd/pelvis with · axial · 0.70mm/px · z∈[-387,-47]mm · 7 of 92 slices shown, 12 images]
[im 12/92  soft-tissue]
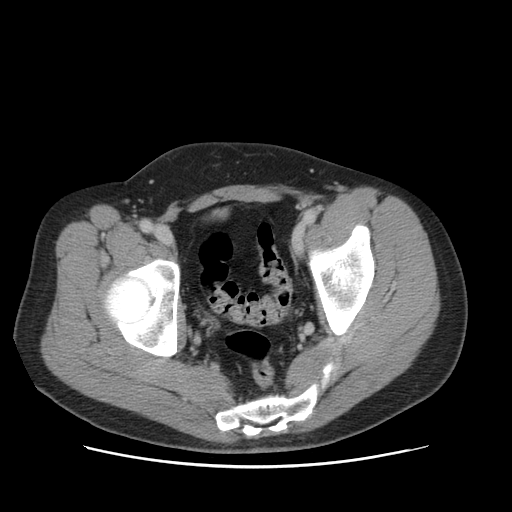
[im 12/92  bone]
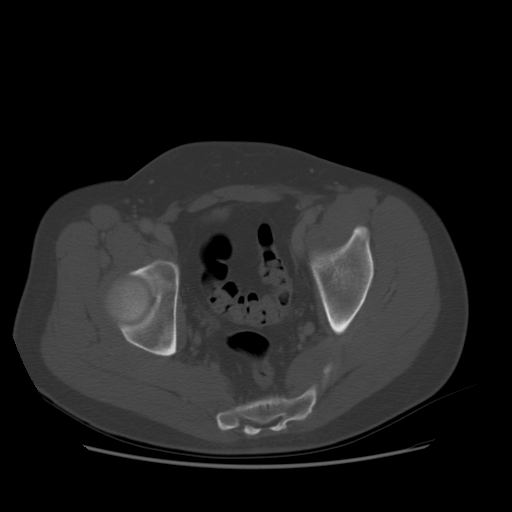
[im 23/92  soft-tissue]
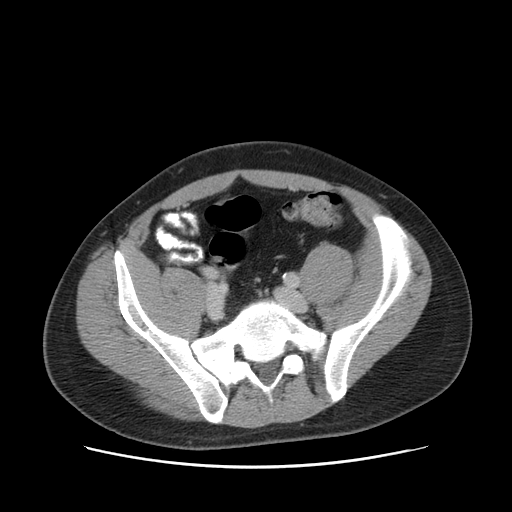
[im 35/92  soft-tissue]
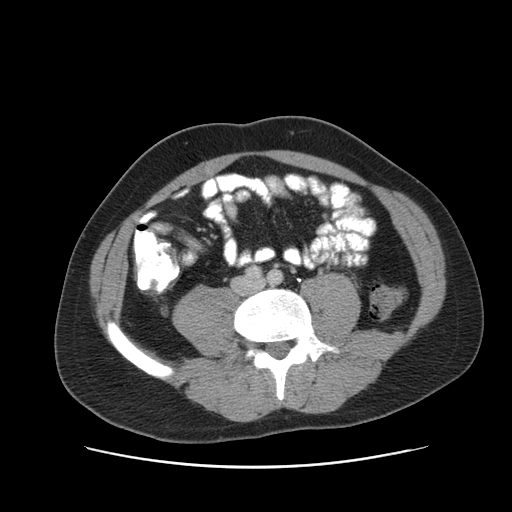
[im 46/92  soft-tissue]
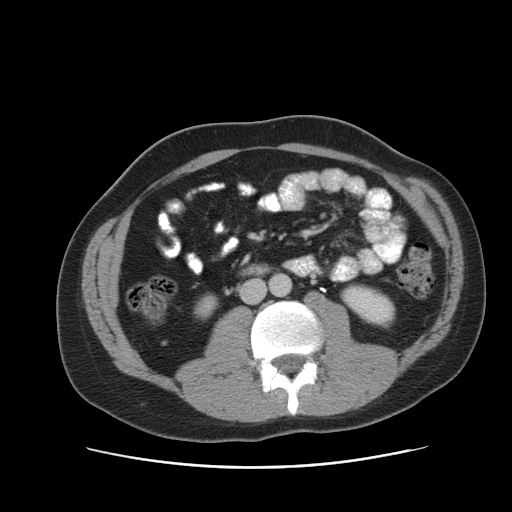
[im 46/92  lung]
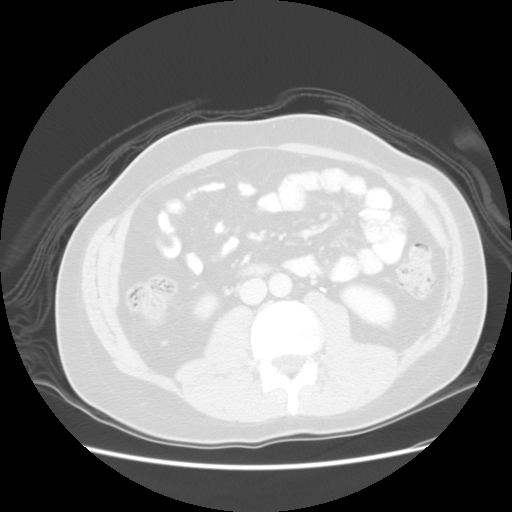
[im 57/92  soft-tissue]
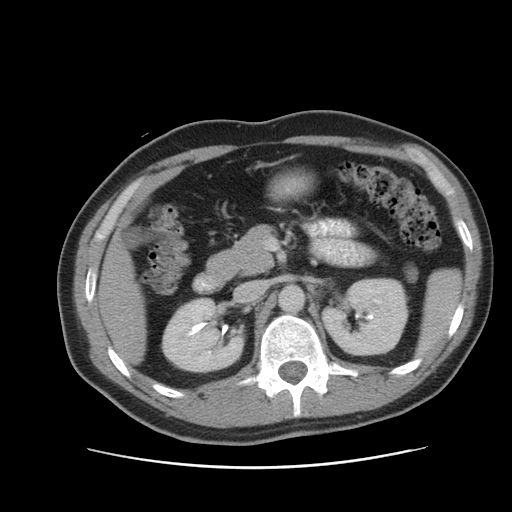
[im 57/92  lung]
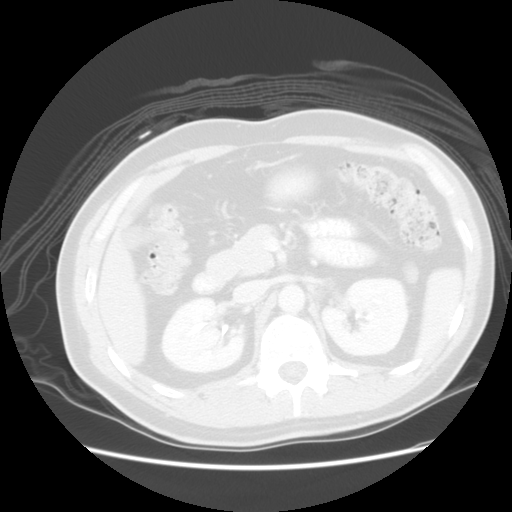
[im 69/92  soft-tissue]
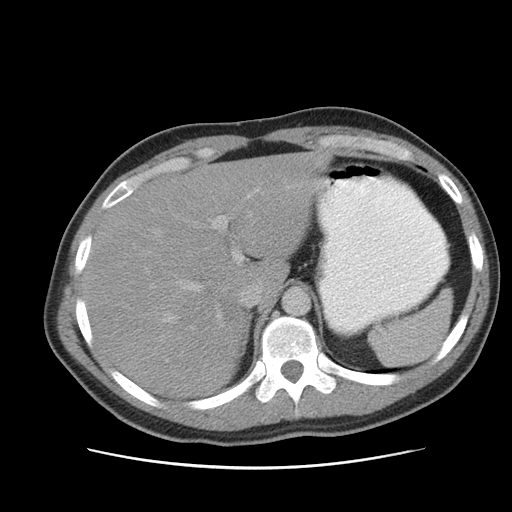
[im 69/92  lung]
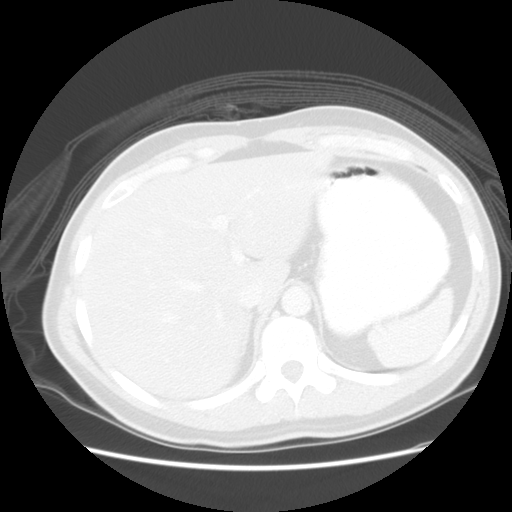
[im 80/92  soft-tissue]
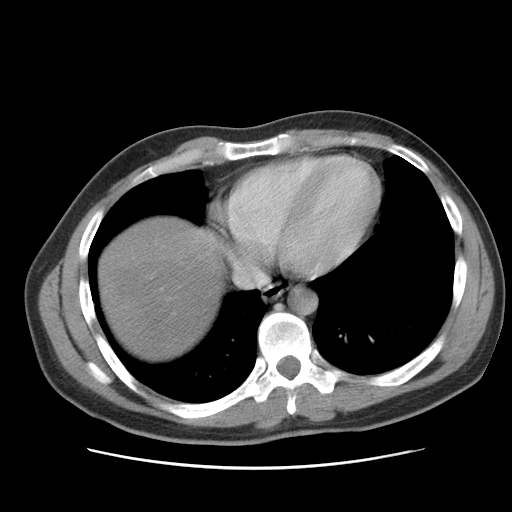
[im 80/92  lung]
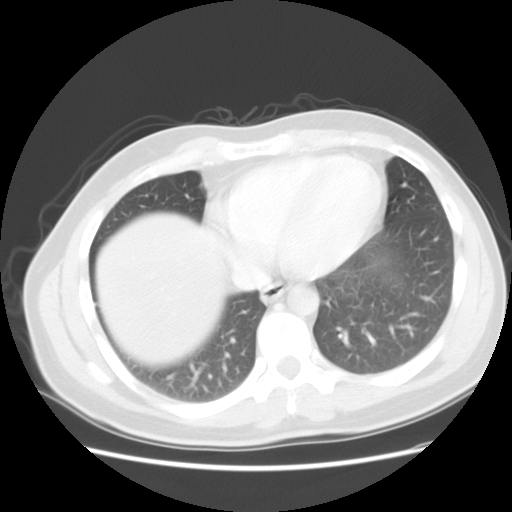

[Series 400: sag · sagittal · 0.93mm/px · 7 of 124 slices shown]
[im 12/124  soft-tissue]
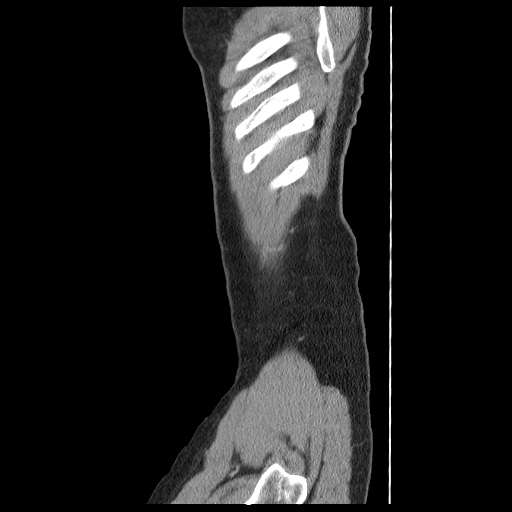
[im 23/124  soft-tissue]
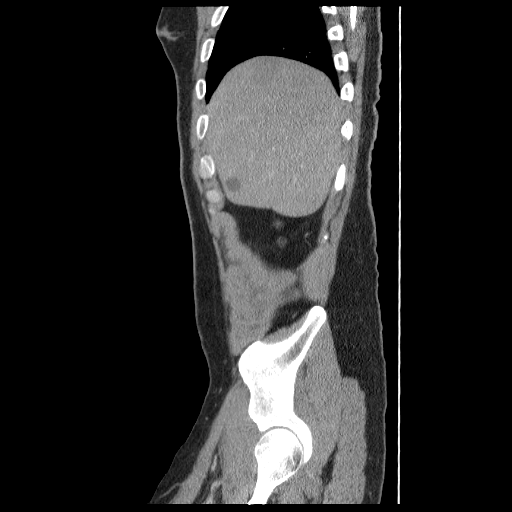
[im 45/124  soft-tissue]
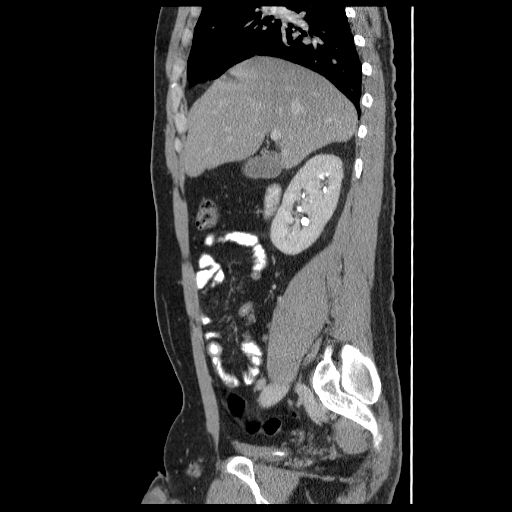
[im 56/124  soft-tissue]
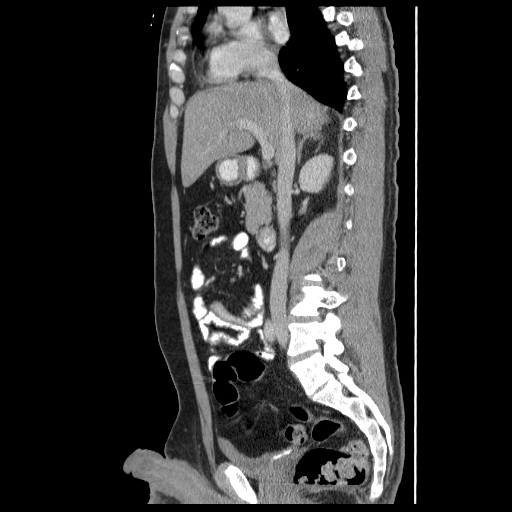
[im 68/124  soft-tissue]
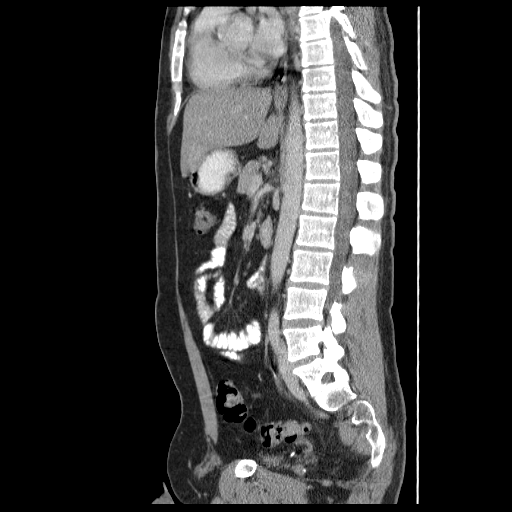
[im 79/124  soft-tissue]
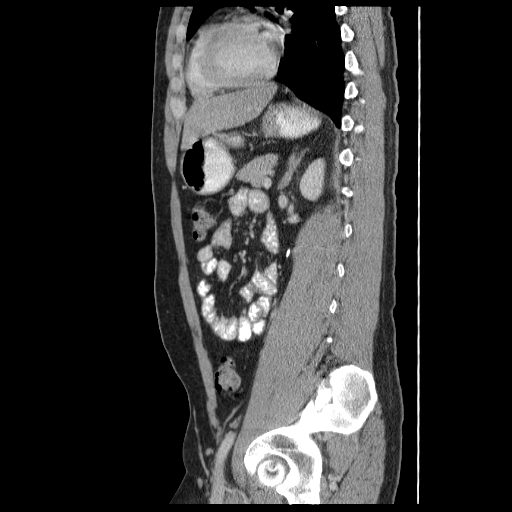
[im 101/124  soft-tissue]
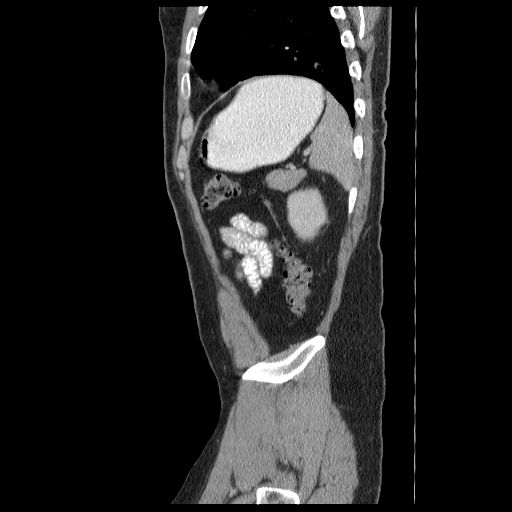

[14 of 32 positions shown; findings below may reference images not displayed]

FINDINGS: The lung bases are clear.  No pleural or pericardial
effusion.  Gynecomastia is noted.

The liver is diffusely low attenuating consistent with fatty
infiltration.  No focal liver lesion is seen.  The gallbladder,
biliary tree, adrenal glands, spleen, pancreas and left kidney are
unremarkable.  Small cyst in the upper pole of the right kidney is
unchanged.  The right kidney otherwise appears normal.

The stomach, small large bowel and appendix all appear normal.  No
focal bony abnormality.
IMPRESSION: 1.  No acute finding or finding to explain the patient's symptoms.
2.  Fatty infiltration of liver.

## 2013-12-27 ENCOUNTER — Encounter: Payer: BC Managed Care – PPO | Admitting: Family Medicine

## 2013-12-27 ENCOUNTER — Telehealth: Payer: Self-pay | Admitting: Family Medicine

## 2013-12-27 NOTE — Telephone Encounter (Signed)
Pt has an appointment today 12/27/13 at 10 am.  She called to r/s due to her phone dying and the alarm didn't go off.  Pt is r/s for 12/28/13 at 10 am.

## 2013-12-27 NOTE — Progress Notes (Signed)
error    This encounter was created in error - please disregard.

## 2013-12-28 ENCOUNTER — Ambulatory Visit (INDEPENDENT_AMBULATORY_CARE_PROVIDER_SITE_OTHER): Payer: BC Managed Care – PPO | Admitting: Family Medicine

## 2013-12-28 ENCOUNTER — Encounter: Payer: Self-pay | Admitting: Family Medicine

## 2013-12-28 VITALS — BP 130/82 | HR 84 | Temp 98.6°F | Ht 66.0 in | Wt 166.4 lb

## 2013-12-28 DIAGNOSIS — Z720 Tobacco use: Secondary | ICD-10-CM

## 2013-12-28 DIAGNOSIS — I10 Essential (primary) hypertension: Secondary | ICD-10-CM

## 2013-12-28 DIAGNOSIS — F649 Gender identity disorder, unspecified: Secondary | ICD-10-CM

## 2013-12-28 DIAGNOSIS — Z79890 Hormone replacement therapy (postmenopausal): Secondary | ICD-10-CM

## 2013-12-28 DIAGNOSIS — D72829 Elevated white blood cell count, unspecified: Secondary | ICD-10-CM

## 2013-12-28 DIAGNOSIS — F17200 Nicotine dependence, unspecified, uncomplicated: Secondary | ICD-10-CM

## 2013-12-28 DIAGNOSIS — K219 Gastro-esophageal reflux disease without esophagitis: Secondary | ICD-10-CM

## 2013-12-28 HISTORY — DX: Gender identity disorder, unspecified: F64.9

## 2013-12-28 HISTORY — DX: Hormone replacement therapy: Z79.890

## 2013-12-28 HISTORY — DX: Gastro-esophageal reflux disease without esophagitis: K21.9

## 2013-12-28 HISTORY — DX: Elevated white blood cell count, unspecified: D72.829

## 2013-12-28 HISTORY — DX: Nicotine dependence, unspecified, uncomplicated: F17.200

## 2013-12-28 NOTE — Progress Notes (Signed)
Pre visit review using our clinic review tool, if applicable. No additional management support is needed unless otherwise documented below in the visit note. 

## 2013-12-28 NOTE — Patient Instructions (Signed)
-  We placed a referral for you as discussed to the hematologist for the elevated cell count. It usually takes about 1-2 weeks to process and schedule this referral. If you have not heard from us regarding this appointment in 2 weeks please contact our office.  -please quit smoking  -follow up in 4-6 months or as needed

## 2013-12-28 NOTE — Progress Notes (Addendum)
No chief complaint on file.   HPI:  Gender Dysphoria, Hx of, in the process of gender reassignment with Dr. Ruby ColaPittaway:  -sees Dr. Ruby ColaPittaway (for hormone therapy, put her on the spironolactone)  -reports: gender reassignment scheduled for July 2016 with Dr. Alric Quanony Meltzer in [place]  -had breast augmentation surgery a few months ago and reports doing well, sees Dr. Benna DunksBarber for this -denies: any issues with taking the hormones, closely monitored by her reproductive endocrinologist  -on asa for risks associated with the hormone therapy  -strong FH htn, long hx of HTN  -Yvonne Harrell - seeing counselor in [place]   Leukocytosis: -she reports labs checked at walk in clinic prior to surgery when I asked her about this -reports they played phone tag  -Chronic elevated WBCs and appears Dr. Alwyn RenHopper wanted to send her to hematologist - she is agreeable to this -no weight loss, fevers  GERD:  -takes prilosec and stable on this -wants refill on this as cheaper then over the counter   Tobacco Use:  -chantix worked before, but then restarted  -smoking 5 cigarettes daily -not really interested in cutting back more   ROS: See pertinent positives and negatives per HPI.  Past Medical History  Diagnosis Date  . Counseling for hormone replacement therapy     started replacement therapy on 10/21/11  . Hypertension   . Gender dysphoria - managed by Dr. Ruby ColaPittaway, reproductive gynecology 12/28/2013  . Hormone replacement therapy - managed by Dr. Ruby ColaPittaway 12/28/2013  . Tobacco use disorder 12/28/2013  . Leukocytosis 12/28/2013  . Esophageal reflux 12/28/2013      Past Surgical History  Procedure Laterality Date  . Tonsillectomy    . Breast enhancement surgery  09/2013    Family History  Problem Relation Age of Onset  . Hypertension Father   . Hypertension Paternal Grandfather     History   Social History  . Marital Status: Married    Spouse Name: N/A    Number of Children: N/A  .  Years of Education: N/A   Social History Main Topics  . Smoking status: Current Every Day Smoker -- 0.50 packs/day    Types: Cigarettes  . Smokeless tobacco: Never Used  . Alcohol Use: 1.0 oz/week    2 drink(s) per week     Comment: occasionally  . Drug Use: No  . Sexual Activity: Yes    Birth Control/ Protection: None   Other Topics Concern  . Not on file   Social History Narrative   Work or School: Systems developeranalyst for time [employer]      Home Situation: lives with husband/partner. Gender dysphoria.      Spiritual Beliefs: none      Lifestyle: no regular exercise; diet is ok             Current outpatient prescriptions:aspirin EC 81 MG tablet, Take 81 mg by mouth daily., Disp: , Rfl: ;  clindamycin (CLEOCIN) 300 MG capsule, Take 1 capsule (300 mg total) by mouth 4 (four) times daily. X 7 days, Disp: 28 capsule, Rfl: 0;  estradiol (ESTRACE) 2 MG tablet, Take 2 mg by mouth 3 (three) times daily. , Disp: , Rfl:  HYDROcodone-acetaminophen (HYCET) 7.5-325 mg/15 ml solution, Take 10 mLs by mouth every 6 (six) hours as needed for moderate pain., Disp: 45 mL, Rfl: 0;  HYDROcodone-acetaminophen (NORCO/VICODIN) 5-325 MG per tablet, Take 0.5 tablets by mouth every 6 (six) hours as needed for moderate pain., Disp: , Rfl: ;  lisinopril (PRINIVIL,ZESTRIL) 10 MG  tablet, Take 1 tablet (10 mg total) by mouth daily., Disp: 90 tablet, Rfl: 1 medroxyPROGESTERone (DEPO-PROVERA) 400 MG/ML SUSP, Inject 200 mg into the muscle every 3 (three) months., Disp: , Rfl: ;  metoprolol (LOPRESSOR) 50 MG tablet, Take 1 tablet (50 mg total) by mouth 2 (two) times daily., Disp: 180 tablet, Rfl: 1;  Multiple Vitamins-Minerals (MULTIVITAMIN WITH MINERALS) tablet, Take 1 tablet by mouth daily., Disp: , Rfl:  omeprazole (PRILOSEC) 20 MG capsule, Take 1 capsule (20 mg total) by mouth daily., Disp: 90 capsule, Rfl: 1;  spironolactone-hydrochlorothiazide (ALDACTAZIDE) 25-25 MG per tablet, Take 1 tablet by mouth daily., Disp: 90 tablet,  Rfl: 1  EXAM:  Filed Vitals:   12/28/13 1000  Height: 5\' 6"  (1.676 m)  Weight: 166 lb 6.4 oz (75.479 kg)   Filed Vitals:   12/28/13 1000  BP: 130/82  Pulse: 84  Temp: 98.6 F (37 C)    There is no weight on file to calculate BMI.  GENERAL: vitals reviewed and listed above, alert, oriented, appears well hydrated and in no acute distress  HEENT: atraumatic, conjunttiva clear, no obvious abnormalities on inspection of external nose and ears  NECK: no obvious masses on inspection  LUNGS: clear to auscultation bilaterally, no wheezes, rales or rhonchi, good air movement  CV: HRRR, no peripheral edema  MS: moves all extremities without noticeable abnormality  PSYCH: pleasant and cooperative, no obvious depression or anxiety  ASSESSMENT AND PLAN:  Discussed the following assessment and plan:  Essential hypertension  Gastroesophageal reflux disease, esophagitis presence not specified - cont current tx  Leukocytosis - Plan: Ambulatory referral to Hematology  Tobacco use disorder -advised to quit, help offered  Hormone replacement therapy - managed by Dr. Ruby ColaPittaway  Gender dysphoria - managed by Dr. Ruby ColaPittaway, reproductive gynecology   -Patient advised to return or notify a doctor immediately if symptoms worsen or persist or new concerns arise.     Kriste BasqueKIM, Yvonne R.

## 2013-12-31 ENCOUNTER — Telehealth: Payer: Self-pay | Admitting: Hematology and Oncology

## 2013-12-31 NOTE — Telephone Encounter (Signed)
LEFT MESSAGE FOR PATIENT AND GAVE NP APPT FOR 11/03 @ 2 W/DR. GORSUCH. CONTACT INFORMATION LEFT FOR PATIENT TO RETURN CALL TO CONFIRM MESSAGE/NP APPT.

## 2014-01-04 ENCOUNTER — Ambulatory Visit: Payer: BC Managed Care – PPO | Admitting: Hematology and Oncology

## 2014-01-04 ENCOUNTER — Ambulatory Visit: Payer: BC Managed Care – PPO

## 2014-01-04 ENCOUNTER — Encounter (HOSPITAL_COMMUNITY): Payer: Self-pay

## 2014-01-04 ENCOUNTER — Emergency Department (HOSPITAL_COMMUNITY)
Admission: EM | Admit: 2014-01-04 | Discharge: 2014-01-04 | Disposition: A | Discharge status: Home / Self Care | Payer: BC Managed Care – PPO | Attending: Emergency Medicine | Admitting: Emergency Medicine

## 2014-01-04 DIAGNOSIS — I10 Essential (primary) hypertension: Secondary | ICD-10-CM | POA: Diagnosis not present

## 2014-01-04 DIAGNOSIS — Z8659 Personal history of other mental and behavioral disorders: Secondary | ICD-10-CM | POA: Diagnosis not present

## 2014-01-04 DIAGNOSIS — F10920 Alcohol use, unspecified with intoxication, uncomplicated: Secondary | ICD-10-CM

## 2014-01-04 DIAGNOSIS — F10129 Alcohol abuse with intoxication, unspecified: Secondary | ICD-10-CM | POA: Diagnosis not present

## 2014-01-04 DIAGNOSIS — Z862 Personal history of diseases of the blood and blood-forming organs and certain disorders involving the immune mechanism: Secondary | ICD-10-CM | POA: Insufficient documentation

## 2014-01-04 DIAGNOSIS — Z720 Tobacco use: Secondary | ICD-10-CM | POA: Insufficient documentation

## 2014-01-04 DIAGNOSIS — Z7982 Long term (current) use of aspirin: Secondary | ICD-10-CM | POA: Diagnosis not present

## 2014-01-04 DIAGNOSIS — K219 Gastro-esophageal reflux disease without esophagitis: Secondary | ICD-10-CM | POA: Insufficient documentation

## 2014-01-04 DIAGNOSIS — Z79899 Other long term (current) drug therapy: Secondary | ICD-10-CM | POA: Diagnosis not present

## 2014-01-04 DIAGNOSIS — R45851 Suicidal ideations: Secondary | ICD-10-CM | POA: Diagnosis present

## 2014-01-04 DIAGNOSIS — F10929 Alcohol use, unspecified with intoxication, unspecified: Secondary | ICD-10-CM | POA: Diagnosis present

## 2014-01-04 LAB — COMPREHENSIVE METABOLIC PANEL
ALBUMIN: 4.1 g/dL (ref 3.5–5.2)
ALK PHOS: 107 U/L (ref 39–117)
ALT: 31 U/L (ref 0–53)
AST: 58 U/L — AB (ref 0–37)
Anion gap: 24 — ABNORMAL HIGH (ref 5–15)
BILIRUBIN TOTAL: 0.2 mg/dL — AB (ref 0.3–1.2)
BUN: 14 mg/dL (ref 6–23)
CHLORIDE: 97 meq/L (ref 96–112)
CO2: 19 meq/L (ref 19–32)
CREATININE: 0.67 mg/dL (ref 0.50–1.35)
Calcium: 9.7 mg/dL (ref 8.4–10.5)
GFR calc Af Amer: >90 mL/min (ref >90)
Glucose, Bld: 99 mg/dL (ref 70–99)
POTASSIUM: 3.8 meq/L (ref 3.7–5.3)
Sodium: 140 mEq/L (ref 137–147)
Total Protein: 8.3 g/dL (ref 6.0–8.3)

## 2014-01-04 LAB — CBC
HEMATOCRIT: 40 % (ref 39.0–52.0)
Hemoglobin: 14.4 g/dL (ref 13.0–17.0)
MCH: 32.6 pg (ref 26.0–34.0)
MCHC: 36 g/dL (ref 30.0–36.0)
MCV: 90.5 fL (ref 78.0–100.0)
PLATELETS: 246 10*3/uL (ref 150–400)
RBC: 4.42 MIL/uL (ref 4.22–5.81)
RDW: 12.1 % (ref 11.5–15.5)
WBC: 13 10*3/uL — AB (ref 4.0–10.5)

## 2014-01-04 LAB — RAPID URINE DRUG SCREEN, HOSP PERFORMED
AMPHETAMINES: NOT DETECTED
BARBITURATES: NOT DETECTED
BENZODIAZEPINES: NOT DETECTED
Cocaine: NOT DETECTED
Opiates: NOT DETECTED
TETRAHYDROCANNABINOL: NOT DETECTED

## 2014-01-04 LAB — ETHANOL: Alcohol, Ethyl (B): 235 mg/dL — ABNORMAL HIGH (ref 0–11)

## 2014-01-04 NOTE — ED Notes (Signed)
Patient walked to the window demanding to be released. She said she came in Voluntarily and " I just want to walk out, you have no legal right to hold me here". Writer explained to patient that only the doctors can write a discharge order and she would need to be evaluated by psychiatrist before she could be discharged. Patient became very loud. Staff kept explaining until patient went back to her room. Q 15 minute continues to maintain safety.

## 2014-01-04 NOTE — ED Notes (Signed)
Pt refused blood draw, notified nurse.

## 2014-01-04 NOTE — ED Notes (Signed)
See vital signs at 1208

## 2014-01-04 NOTE — ED Notes (Signed)
Pt brought in voluntarily by [employer], pt is a transgender and sent her girlfriend a video of her loading a gun and stating that she was going to kill herself if they broke up, pt also has a wife at home that knows nothing about this affair. Pt admits to sending the video and the police have her gun.

## 2014-01-04 NOTE — [facility] Suicide Risk Assessment (Cosign Needed)
Suicide Risk Assessment  Discharge Assessment     Demographic Factors:  NA  Total Time spent with patient: 30 minutes  Psychiatric Specialty Exam:     Blood pressure 121/82, pulse 120, temperature 99.1 F (37.3 C), temperature source Oral, resp. rate 14, SpO2 100 %.There is no weight on file to calculate BMI.  General Appearance: Casual  Eye Contact:: Good  Speech: Clear and Coherent and Normal Rate  Volume: Normal  Mood: Depressed  Affect: Appropriate  Thought Process: Circumstantial, Coherent and Goal Directed  Orientation: Full (Time, Place, and Person)  Thought Content: WDL  Suicidal Thoughts: No  Homicidal Thoughts: No  Memory: Immediate; Good Recent; Good Remote; Good  Judgement: Intact  Insight: Good and Present  Psychomotor Activity: Normal  Concentration: Good  Recall: Good  Fund of Knowledge:Good  Language: Good  Akathisia: No  Handed: Right  AIMS (if indicated):    Assets: Communication Skills Desire for Improvement Housing Resilience Social Support Transportation  Sleep:     Musculoskeletal: Strength & Muscle Tone: within normal limits Gait & Station: normal Patient leans: N/A   Mental Status Per Nursing Assessment::   On Admission:     Current Mental Status by Physician: Patient denies suicidal/homicidal ideation, psychosis, and paranoia  Loss Factors: NA  Historical Factors: NA  Risk Reduction Factors:   Sense of responsibility to family, Living with another person, especially a relative, Positive social support and Positive therapeutic relationship  Continued Clinical Symptoms:  Denies any; none noted  Cognitive Features That Contribute To Risk:  None noted    Suicide Risk:  Minimal: No identifiable suicidal ideation.  Patients presenting with no risk factors but with morbid ruminations; may be classified as minimal risk based on the severity of the depressive  symptoms  Discharge Diagnoses: AXIS I: Alcohol intoxication AXIS II: Deferred AXIS III:  Past Medical History  Diagnosis Date  . Counseling for hormone replacement therapy     started replacement therapy on 10/21/11  . Hypertension   . Gender dysphoria - managed by Dr. Ruby ColaPittaway, reproductive gynecology 12/28/2013  . Hormone replacement therapy - managed by Dr. Ruby ColaPittaway 12/28/2013  . Tobacco use disorder 12/28/2013  . Leukocytosis 12/28/2013  . Esophageal reflux 12/28/2013   AXIS IV: other psychosocial or environmental problems AXIS V: 61-70 mild symptoms   Plan Of Care/Follow-up recommendations:  Activity:  as tolerated Diet:  as tolerated  Is patient on multiple antipsychotic therapies at discharge:  No   Has Patient had three or more failed trials of antipsychotic monotherapy by history:  No  Recommended Plan for Multiple Antipsychotic Therapies: NA    Rankin, Shuvon, FNP-BC 01/04/2014, 1:05 PM

## 2014-01-04 NOTE — ED Notes (Signed)
Pt is intoxicated.

## 2014-01-04 NOTE — Progress Notes (Signed)
This patient 28 years old caucasian trans gender admitted due to suicide gesture. Patient reported; " I sent video on myself holding a loaded gun to someone in [place]". Per report patient send picture of loaded gun to a girlfriend in [place]. The girlfriend called police department to report her.  She stated that she took Depo vera injection on the 21st of this month and the medication was making her feel moody, sad and depressed. Patient denied SI/HI and denied Hallucinations. She appeared sad and depressed. Q 15 minute check initiated.

## 2014-01-04 NOTE — Consult Note (Signed)
[facility] Face-to-Face Psychiatry Consult   Reason for Consult:  Suicidal ideation Referring Physician:  EDP  Yvonne Harrell is an 28 y.o. female. Total Time spent with patient: 45 minutes  Assessment: AXIS I:  Alcohol intoxication AXIS II:  Deferred AXIS III:   Past Medical History  Diagnosis Date  . Counseling for hormone replacement therapy     started replacement therapy on 10/21/11  . Hypertension   . Gender dysphoria - managed by Dr. Vella Raring, reproductive gynecology 12/28/2013  . Hormone replacement therapy - managed by Dr. Vella Raring 12/28/2013  . Tobacco use disorder 12/28/2013  . Leukocytosis 12/28/2013  . Esophageal reflux 12/28/2013   AXIS IV:  other psychosocial or environmental problems AXIS V:  61-70 mild symptoms  Plan:  Outpatient services  Subjective:   Yvonne Harrell is a 28 y.o. female patient presents to [facility] brought in by police.  Patient states "The cops brought me cause I was drunk and posted something stupid on line about my well being and thought of taking my life.  I posted a video of me loading a gun."  Patient states that she had been drinking alcohol and "I had got the Depo shot.  When I get it I usually get emotional and it makes me feel odd; I guess that combined with the alcohol made me act stupid; No I don't want to kill myself."  Patient states that she lives at home with her wife and wife is supportive and works as a Location manager.  Patient states that she was born a female and is in the process of becoming a trans gender (female) also asked to be addressed as she.  transformation will be completed Mid June 2016.  Patient has outpatient services (counseling) with Tereso Newcomer at [facility].  Patient states that she can follow up there. Patient denies suicidal/homicidal ideation, psychosis, and paranoia.    TTS Jamie Kato) spoke with wife of patient and was informed had no concerns about patient that patient will be able to follow up with counselor and that she would be  home with patient.    HPI Elements:   Location:  Depression. Quality:  suicidal ideation. Severity:  posting video on facebook loading a gun. Timing:  1 day. Review of Systems  Psychiatric/Behavioral: Depression: Denies. Suicidal ideas: Denies. Hallucinations: Denies. Memory loss: Denies. Substance abuse: Denies. Nervous/anxious: Denies. Insomnia: Denies.   All other systems reviewed and are negative.  Family History  Problem Relation Age of Onset  . Hypertension Father   . Hypertension Paternal Grandfather      Past Psychiatric History: Past Medical History  Diagnosis Date  . Counseling for hormone replacement therapy     started replacement therapy on 10/21/11  . Hypertension   . Gender dysphoria - managed by Dr. Vella Raring, reproductive gynecology 12/28/2013  . Hormone replacement therapy - managed by Dr. Vella Raring 12/28/2013  . Tobacco use disorder 12/28/2013  . Leukocytosis 12/28/2013  . Esophageal reflux 12/28/2013    reports that he has been smoking Cigarettes.  He has been smoking about 0.50 packs per day. He has never used smokeless tobacco. He reports that he drinks about 1.0 oz of alcohol per week. He reports that he does not use illicit drugs. Family History  Problem Relation Age of Onset  . Hypertension Father   . Hypertension Paternal Grandfather            Allergies:  No Known Allergies  ACT Assessment Complete:  Yes:    Educational Status  Risk to Self: Risk to self with the past 6 months Is patient at risk for suicide?: Yes Substance abuse history and/or treatment for substance abuse?: Yes  Risk to Others:    Abuse:    Prior Inpatient Therapy:    Prior Outpatient Therapy:    Additional Information:                    Objective: Blood pressure 121/82, pulse 120, temperature 99.1 F (37.3 C), temperature source Oral, resp. rate 14, SpO2 100 %.There is no weight on file to calculate BMI. Results for orders placed or performed during  the hospital encounter of 01/04/14 (from the past 72 hour(s))  Urine Drug Screen     Status: None   Collection Time: 01/04/14  4:38 AM  Result Value Ref Range   Opiates NONE DETECTED NONE DETECTED   Cocaine NONE DETECTED NONE DETECTED   Benzodiazepines NONE DETECTED NONE DETECTED   Amphetamines NONE DETECTED NONE DETECTED   Tetrahydrocannabinol NONE DETECTED NONE DETECTED   Barbiturates NONE DETECTED NONE DETECTED    Comment:        DRUG SCREEN FOR MEDICAL PURPOSES ONLY.  IF CONFIRMATION IS NEEDED FOR ANY PURPOSE, NOTIFY LAB WITHIN 5 DAYS.        LOWEST DETECTABLE LIMITS FOR URINE DRUG SCREEN Drug Class       Cutoff (ng/mL) Amphetamine      1000 Barbiturate      200 Benzodiazepine   720 Tricyclics       947 Opiates          300 Cocaine          300 THC              50   CBC     Status: Abnormal   Collection Time: 01/04/14  4:58 AM  Result Value Ref Range   WBC 13.0 (H) 4.0 - 10.5 K/uL   RBC 4.42 4.22 - 5.81 MIL/uL   Hemoglobin 14.4 13.0 - 17.0 g/dL   HCT 40.0 39.0 - 52.0 %   MCV 90.5 78.0 - 100.0 fL   MCH 32.6 26.0 - 34.0 pg   MCHC 36.0 30.0 - 36.0 g/dL   RDW 12.1 11.5 - 15.5 %   Platelets 246 150 - 400 K/uL  Comprehensive metabolic panel     Status: Abnormal   Collection Time: 01/04/14  4:58 AM  Result Value Ref Range   Sodium 140 137 - 147 mEq/L   Potassium 3.8 3.7 - 5.3 mEq/L   Chloride 97 96 - 112 mEq/L   CO2 19 19 - 32 mEq/L   Glucose, Bld 99 70 - 99 mg/dL   BUN 14 6 - 23 mg/dL   Creatinine, Ser 0.67 0.50 - 1.35 mg/dL   Calcium 9.7 8.4 - 10.5 mg/dL   Total Protein 8.3 6.0 - 8.3 g/dL   Albumin 4.1 3.5 - 5.2 g/dL   AST 58 (H) 0 - 37 U/L    Comment: SLIGHT HEMOLYSIS HEMOLYSIS AT THIS LEVEL MAY AFFECT RESULT    ALT 31 0 - 53 U/L   Alkaline Phosphatase 107 39 - 117 U/L   Total Bilirubin 0.2 (L) 0.3 - 1.2 mg/dL   GFR calc non Af Amer >90 >90 mL/min   GFR calc Af Amer >90 >90 mL/min    Comment: (NOTE) The eGFR has been calculated using the CKD EPI  equation. This calculation has not been validated in all clinical situations. eGFR's persistently <90 mL/min signify  possible Chronic Kidney Disease.    Anion gap 24 (H) 5 - 15    Comment: RESULT CHECKED  Ethanol (ETOH)     Status: Abnormal   Collection Time: 01/04/14  4:58 AM  Result Value Ref Range   Alcohol, Ethyl (B) 235 (H) 0 - 11 mg/dL    Comment:        LOWEST DETECTABLE LIMIT FOR SERUM ALCOHOL IS 11 mg/dL FOR MEDICAL PURPOSES ONLY    Labs are reviewed and abnormal values listed above.  Medication reviewed and no changes made  No current facility-administered medications for this encounter.   Current Outpatient Prescriptions  Medication Sig Dispense Refill  . aspirin EC 81 MG tablet Take 81 mg by mouth daily.    . estradiol (ESTRACE) 2 MG tablet Take 2 mg by mouth 3 (three) times daily.     . lisinopril (PRINIVIL,ZESTRIL) 10 MG tablet Take 1 tablet (10 mg total) by mouth daily. 90 tablet 1  . medroxyPROGESTERone (DEPO-PROVERA) 150 MG/ML injection Inject 150 mg into the muscle every 3 (three) months.    . metoprolol (LOPRESSOR) 50 MG tablet Take 1 tablet (50 mg total) by mouth 2 (two) times daily. 180 tablet 1  . Multiple Vitamin (MULTIVITAMIN WITH MINERALS) TABS tablet Take 1 tablet by mouth daily. Women's One A Day    . omeprazole (PRILOSEC) 20 MG capsule Take 1 capsule (20 mg total) by mouth daily. 90 capsule 1  . spironolactone-hydrochlorothiazide (ALDACTAZIDE) 25-25 MG per tablet Take 1 tablet by mouth daily. 90 tablet 1    Psychiatric Specialty Exam:     Blood pressure 121/82, pulse 120, temperature 99.1 F (37.3 C), temperature source Oral, resp. rate 14, SpO2 100 %.There is no weight on file to calculate BMI.  General Appearance: Casual  Eye Contact::  Good  Speech:  Clear and Coherent and Normal Rate  Volume:  Normal  Mood:  Depressed  Affect:  Appropriate  Thought Process:  Circumstantial, Coherent and Goal Directed  Orientation:  Full (Time, Place, and  Person)  Thought Content:  WDL  Suicidal Thoughts:  No  Homicidal Thoughts:  No  Memory:  Immediate;   Good Recent;   Good Remote;   Good  Judgement:  Intact  Insight:  Good and Present  Psychomotor Activity:  Normal  Concentration:  Good  Recall:  Good  Fund of Knowledge:Good  Language: Good  Akathisia:  No  Handed:  Right  AIMS (if indicated):     Assets:  Communication Skills Desire for Improvement Housing Resilience Social Support Transportation  Sleep:      Musculoskeletal: Strength & Muscle Tone: within normal limits Gait & Station: normal Patient leans: N/A  Treatment Plan Summary: Discharge home.  Patient to follow up with Shanna Cole.  Call for appointment.    Rankin, Shuvon, FNP-BC 01/04/2014 12:43 PM  Patient seen, evaluated and I agree with notes by Nurse Practitioner.  , MD 

## 2014-01-04 NOTE — BH Assessment (Signed)
D/C per Assunta FoundShuvon Rankin, NP and Dr. Jannifer FranklinAkintayo. Patient provided consent for this writer to contact her spouse Brandon,Jennifer  [phone]. Writer spoke to spouse whom expressed no further concerns that this time. Sts that she is willing to watch over patient for the next several days. She also understands that she is able to [facility] if needed. Additionally, patient [facility]. Patient has a current mental health provider Rober MinionShannon Cole and plans to follow up with this provider following discharge. Writer offered to schedule patient's appointment for her; patient declined. Sts, "I will make my own appointment thank you".

## 2014-01-04 NOTE — ED Notes (Signed)
Patient is requesting to leave. I reported it to the RN

## 2014-01-04 NOTE — Discharge Instructions (Signed)
Alcohol Intoxication °Alcohol intoxication occurs when you drink enough alcohol that it affects your ability to function. It can be mild or very severe. Drinking a lot of alcohol in a short time is called binge drinking. This can be very harmful. Drinking alcohol can also be more dangerous if you are taking medicines or other drugs. Some of the effects caused by alcohol may include: °· Loss of coordination. °· Changes in mood and behavior. °· Unclear thinking. °· Trouble talking (slurred speech). °· Throwing up (vomiting). °· Confusion. °· Slowed breathing. °· Twitching and shaking (seizures). °· Loss of consciousness. °HOME CARE °· Do not drive after drinking alcohol. °· Drink enough water and fluids to keep your pee (urine) clear or pale yellow. Avoid caffeine. °· Only take medicine as told by your doctor. °GET HELP IF: °· You throw up (vomit) many times. °· You do not feel better after a few days. °· You frequently have alcohol intoxication. Your doctor can help decide if you should see a substance use treatment counselor. °GET HELP RIGHT AWAY IF: °· You become shaky when you stop drinking. °· You have twitching and shaking. °· You throw up blood. It may look bright red or like coffee grounds. °· You notice blood in your poop (bowel movements). °· You become lightheaded or pass out (faint). °MAKE SURE YOU:  °· Understand these instructions. °· Will watch your condition. °· Will get help right away if you are not doing well or get worse. °Document Released: 08/07/2007 Document Revised: 10/21/2012 Document Reviewed: 07/24/2012 °ExitCare® Patient Information ©2015 [employer]. This information is not intended to replace advice given to you by your health care provider. Make sure you discuss any questions you have with your health care provider. ° °

## 2014-01-04 NOTE — ED Notes (Signed)
Pt gets a birth control injection and her wife states that she gets emotional after that.

## 2014-01-04 NOTE — ED Provider Notes (Signed)
CSN: [id number]     Arrival date & time 01/04/14  0416 History   First MD Initiated Contact with Patient 01/04/14 0539     Chief Complaint  Patient presents with  . Suicidal     (Consider location/radiation/quality/duration/timing/severity/associated sxs/prior Treatment) HPI Comments: This is a 28 year old female who is in gender transition.  Presents tonight in the accompanying police after a suicidal video was sent to a friend.  The video included the patient loading a gun and threatening to kill himself/ herself. Patient states that indeed she did do this, but it was after a night of drinking.  She states she is not suicidal at this moment.  She does own a gun and it was found under her pillow when the police arrived.  She has no past history of suicide attempts overdoses. She states she has a Veterinary surgeoncounselor that she sees every 6-8 weeks during her sexual transition.  She has not seen this therapist since June.  She states she is easy to get a hold of. At this time.  She agrees to participate in the evaluation rather than have IVC papers and official commitment.  The history is provided by the patient.    Past Medical History  Diagnosis Date  . Counseling for hormone replacement therapy     started replacement therapy on 10/21/11  . Hypertension   . Gender dysphoria - managed by Dr. Ruby ColaPittaway, reproductive gynecology 12/28/2013  . Hormone replacement therapy - managed by Dr. Ruby ColaPittaway 12/28/2013  . Tobacco use disorder 12/28/2013  . Leukocytosis 12/28/2013  . Esophageal reflux 12/28/2013   Past Surgical History  Procedure Laterality Date  . Tonsillectomy    . Breast enhancement surgery  09/2013   Family History  Problem Relation Age of Onset  . Hypertension Father   . Hypertension Paternal Grandfather    History  Substance Use Topics  . Smoking status: Current Every Day Smoker -- 0.50 packs/day    Types: Cigarettes  . Smokeless tobacco: Never Used  . Alcohol Use: 1.0 oz/week   2 drink(s) per week     Comment: occasionally    Review of Systems  Constitutional: Negative for fever.  Respiratory: Negative for shortness of breath.   Cardiovascular: Negative for chest pain.  Gastrointestinal: Negative for abdominal pain.  Psychiatric/Behavioral: Positive for suicidal ideas and self-injury.      Allergies  Review of patient's allergies indicates no known allergies.  Home Medications   Prior to Admission medications   Medication Sig Start Date End Date Taking? Authorizing Provider  aspirin EC 81 MG tablet Take 81 mg by mouth daily.   Yes Historical Provider, MD  estradiol (ESTRACE) 2 MG tablet Take 2 mg by mouth 3 (three) times daily.    Yes Historical Provider, MD  lisinopril (PRINIVIL,ZESTRIL) 10 MG tablet Take 1 tablet (10 mg total) by mouth daily. 08/27/13  Yes Terressa KoyanagiHannah R Kim, DO  medroxyPROGESTERone (DEPO-PROVERA) 150 MG/ML injection Inject 150 mg into the muscle every 3 (three) months.   Yes Historical Provider, MD  metoprolol (LOPRESSOR) 50 MG tablet Take 1 tablet (50 mg total) by mouth 2 (two) times daily. 08/27/13  Yes Terressa KoyanagiHannah R Kim, DO  Multiple Vitamin (MULTIVITAMIN WITH MINERALS) TABS tablet Take 1 tablet by mouth daily. Women's One A Day   Yes Historical Provider, MD  omeprazole (PRILOSEC) 20 MG capsule Take 1 capsule (20 mg total) by mouth daily. 08/27/13  Yes Terressa KoyanagiHannah R Kim, DO  spironolactone-hydrochlorothiazide (ALDACTAZIDE) 25-25 MG per tablet Take 1 tablet by  mouth daily. 08/27/13  Yes Terressa KoyanagiHannah R Kim, DO   BP 117/75 mmHg  Pulse 124  Temp(Src) 98.4 F (36.9 C) (Oral)  Resp 20  SpO2 96% Physical Exam  Constitutional: He is oriented to person, place, and time. He appears well-developed and well-nourished.  HENT:  Head: Normocephalic.  Eyes: Pupils are equal, round, and reactive to light.  Neck: Normal range of motion.  Cardiovascular: Normal rate and regular rhythm.   Pulmonary/Chest: Effort normal.  Abdominal: Soft.  Neurological: He is alert and  oriented to person, place, and time.  Psychiatric: He has a normal mood and affect. His speech is normal and behavior is normal. Cognition and memory are normal. He expresses impulsivity. He expresses suicidal ideation. He expresses suicidal plans.  At this time.  Denying suicidality  Vitals reviewed.   ED Course  Procedures (including critical care time) Labs Review Labs Reviewed  CBC - Abnormal; Notable for the following:    WBC 13.0 (*)    All other components within normal limits  COMPREHENSIVE METABOLIC PANEL - Abnormal; Notable for the following:    AST 58 (*)    Total Bilirubin 0.2 (*)    Anion gap 24 (*)    All other components within normal limits  ETHANOL - Abnormal; Notable for the following:    Alcohol, Ethyl (B) 235 (*)    All other components within normal limits  URINE RAPID DRUG SCREEN (HOSP PERFORMED)    Imaging Review No results found.   EKG Interpretation None      MDM   Final diagnoses:  None         Arman FilterGail K Sophee Mckimmy, NP 01/04/14 [phone]

## 2014-01-24 ENCOUNTER — Other Ambulatory Visit: Payer: Self-pay | Admitting: Family Medicine

## 2014-03-04 HISTORY — PX: OTHER SURGICAL HISTORY: SHX169

## 2014-04-05 ENCOUNTER — Encounter: Payer: Self-pay | Admitting: Family Medicine

## 2014-04-05 ENCOUNTER — Ambulatory Visit (INDEPENDENT_AMBULATORY_CARE_PROVIDER_SITE_OTHER): Payer: BLUE CROSS/BLUE SHIELD | Admitting: Family Medicine

## 2014-04-05 ENCOUNTER — Encounter: Payer: Self-pay | Admitting: *Deleted

## 2014-04-05 VITALS — BP 128/82 | HR 97 | Temp 98.5°F | Ht 66.0 in | Wt 164.9 lb

## 2014-04-05 DIAGNOSIS — I10 Essential (primary) hypertension: Secondary | ICD-10-CM

## 2014-04-05 DIAGNOSIS — F649 Gender identity disorder, unspecified: Secondary | ICD-10-CM

## 2014-04-05 DIAGNOSIS — D72829 Elevated white blood cell count, unspecified: Secondary | ICD-10-CM

## 2014-04-05 NOTE — Progress Notes (Signed)
HPI:  HTN: -well controlled -on metoprolol and lisinopril, spironolactone-hctz 25-25, metop 50 mg bid when first came to me -report workup with prior PCP, strong FH HTN -reports spironolactone is from her endocrinologist -denies: CP, SOB, DOE, palpitations, swelling, HA -reports needs letter stating BP well controlled on her current medications for preparation for a gender reassignment surgery  Leukocytosis: -chronic, referred to heme/onc -reports left 5 messages to reschedule her appt and nobody returned her call  Depression: -on ROC visit to ED since last visit with me for ? SI -pt denies this or drinking or depression and reports this occurred due to a friend whom misinterpreted her drunk online post and called authorities -she reports she is fine and denies any depression, SI, anxiety or thoughts of self harm or harm to others -has a counselor  ROS: See pertinent positives and negatives per HPI.  Past Medical History  Diagnosis Date  . Counseling for hormone replacement therapy     started replacement therapy on 10/21/11  . Hypertension   . Gender dysphoria - managed by Dr. Ruby ColaPittaway, reproductive gynecology 12/28/2013  . Hormone replacement therapy - managed by Dr. Ruby ColaPittaway 12/28/2013  . Tobacco use disorder 12/28/2013  . Leukocytosis 12/28/2013  . Esophageal reflux 12/28/2013    Past Surgical History  Procedure Laterality Date  . Tonsillectomy    . Breast enhancement surgery  09/2013    Family History  Problem Relation Age of Onset  . Hypertension Father   . Hypertension Paternal Grandfather     History   Social History  . Marital Status: Married    Spouse Name: N/A    Number of Children: N/A  . Years of Education: N/A   Social History Main Topics  . Smoking status: Current Every Day Smoker -- 0.50 packs/day    Types: Cigarettes  . Smokeless tobacco: Never Used  . Alcohol Use: 1.0 oz/week    2 drink(s) per week     Comment: occasionally  . Drug Use:  No  . Sexual Activity: Yes    Birth Control/ Protection: None   Other Topics Concern  . None   Social History Narrative   Work or School: Systems developeranalyst for time [employer]      Home Situation: lives with husband/partner. Gender dysphoria.      Spiritual Beliefs: none      Lifestyle: no regular exercise; diet is ok              Current outpatient prescriptions:  .  aspirin EC 81 MG tablet, Take 81 mg by mouth daily., Disp: , Rfl:  .  estradiol (ESTRACE) 2 MG tablet, Take 2 mg by mouth 3 (three) times daily. , Disp: , Rfl:  .  lisinopril (PRINIVIL,ZESTRIL) 10 MG tablet, TAKE 1 TABLET DAILY, Disp: 90 tablet, Rfl: 0 .  metoprolol (LOPRESSOR) 50 MG tablet, TAKE 1 TABLET TWICE A DAY, Disp: 180 tablet, Rfl: 0 .  omeprazole (PRILOSEC) 20 MG capsule, TAKE 1 CAPSULE DAILY, Disp: 90 capsule, Rfl: 0 .  spironolactone-hydrochlorothiazide (ALDACTAZIDE) 25-25 MG per tablet, TAKE 1 TABLET DAILY, Disp: 90 tablet, Rfl: 0  EXAM:  Filed Vitals:   04/05/14 1334  BP: 128/82  Pulse: 97  Temp: 98.5 F (36.9 C)    Body mass index is 26.63 kg/(m^2).  GENERAL: vitals reviewed and listed above, alert, oriented, appears well hydrated and in no acute distress  HEENT: atraumatic, conjunttiva clear, no obvious abnormalities on inspection of external nose and ears  NECK: no obvious masses on inspection  LUNGS: clear to auscultation bilaterally, no wheezes, rales or rhonchi, good air movement  CV: HRRR, no peripheral edema  MS: moves all extremities without noticeable abnormality  PSYCH: pleasant and cooperative, no obvious depression or anxiety  ASSESSMENT AND PLAN:  Discussed the following assessment and plan:  Essential hypertension -well controlled, letter written -advised stopping acei on day of surgery -advised follow ing reproductive endocrinologists recs regarding other medications, asa held 1 week prior to surgery  Leukocytosis -referred to hematology in the past, offered to recheck  today - she declined -pt reports she called and canceled appt as had conflict - reports she has left 5 messages with the oncology center to reschedule and nobody called her back -my assistant called to try to schedule appt and was advised by Selena Batten at the oncology center that they would call patient  Advised to seek care immediately if ever has depressed mood, change in mood or psychiatric concerns  -Patient advised to return or notify a doctor immediately if symptoms worsen or persist or new concerns arise.  There are no Patient Instructions on file for this visit.   Yvonne Basque R.

## 2014-04-05 NOTE — Progress Notes (Signed)
Pre visit review using our clinic review tool, if applicable. No additional management support is needed unless otherwise documented below in the visit note. 

## 2014-04-06 ENCOUNTER — Telehealth: Payer: Self-pay | Admitting: Family Medicine

## 2014-04-06 NOTE — Telephone Encounter (Signed)
emmi emailed °

## 2014-04-25 ENCOUNTER — Other Ambulatory Visit: Payer: Self-pay | Admitting: Family Medicine

## 2014-06-28 ENCOUNTER — Ambulatory Visit (INDEPENDENT_AMBULATORY_CARE_PROVIDER_SITE_OTHER): Payer: BLUE CROSS/BLUE SHIELD | Admitting: Family Medicine

## 2014-06-28 DIAGNOSIS — R69 Illness, unspecified: Secondary | ICD-10-CM

## 2014-06-28 NOTE — Progress Notes (Signed)
NO SHOW

## 2014-07-05 ENCOUNTER — Other Ambulatory Visit: Payer: Self-pay | Admitting: Family Medicine

## 2014-10-03 ENCOUNTER — Other Ambulatory Visit: Payer: Self-pay | Admitting: Family Medicine

## 2014-10-03 ENCOUNTER — Other Ambulatory Visit: Payer: Self-pay | Admitting: *Deleted

## 2014-10-03 MED ORDER — METOPROLOL TARTRATE 50 MG PO TABS: 50.0000 mg | ORAL_TABLET | Freq: Two times a day (BID) | ORAL | Status: DC

## 2014-10-03 MED ORDER — SPIRONOLACTONE-HCTZ 25-25 MG PO TABS: 1.0000 | ORAL_TABLET | Freq: Every day | ORAL | Status: DC

## 2014-10-03 MED ORDER — OMEPRAZOLE 20 MG PO CPDR: 20.0000 mg | DELAYED_RELEASE_CAPSULE | Freq: Every day | ORAL | Status: DC

## 2014-10-03 MED ORDER — LISINOPRIL 10 MG PO TABS: 10.0000 mg | ORAL_TABLET | Freq: Every day | ORAL | Status: DC

## 2014-10-03 NOTE — Telephone Encounter (Signed)
Rxs sent to pts local pharmacy.  Mail order Rxs denied as pt needs appt.

## 2014-12-22 ENCOUNTER — Encounter: Payer: Self-pay | Admitting: Family Medicine

## 2014-12-22 ENCOUNTER — Ambulatory Visit (INDEPENDENT_AMBULATORY_CARE_PROVIDER_SITE_OTHER): Payer: BLUE CROSS/BLUE SHIELD | Admitting: Family Medicine

## 2014-12-22 VITALS — BP 122/80 | HR 90 | Temp 98.6°F | Ht 66.0 in | Wt 150.6 lb

## 2014-12-22 DIAGNOSIS — F17200 Nicotine dependence, unspecified, uncomplicated: Secondary | ICD-10-CM

## 2014-12-22 DIAGNOSIS — I10 Essential (primary) hypertension: Secondary | ICD-10-CM

## 2014-12-22 DIAGNOSIS — D72829 Elevated white blood cell count, unspecified: Secondary | ICD-10-CM

## 2014-12-22 DIAGNOSIS — Z79890 Hormone replacement therapy (postmenopausal): Secondary | ICD-10-CM

## 2014-12-22 MED ORDER — METOPROLOL TARTRATE 50 MG PO TABS: 50.0000 mg | ORAL_TABLET | Freq: Two times a day (BID) | ORAL | Status: DC

## 2014-12-22 MED ORDER — LISINOPRIL 10 MG PO TABS: 10.0000 mg | ORAL_TABLET | Freq: Every day | ORAL | Status: AC

## 2014-12-22 NOTE — Progress Notes (Signed)
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 Alric Quanony Meltzer in [place]  -sees Dr. Ruby ColaPittaway (for hormone therapy, put her on the spironolactone)  -sees Dr. Benna DunksBarber also -denies: any issues with taking the hormones, closely monitored by her reproductive endocrinologist  -on asa for risks associated with the hormone therapy  -strong FH htn, long hx of HTN  -[name] - seeing counselor in [place]  -denies depression, anciety  Tobacco Use:  -smoking 5 cigarettes daily -not really interested in cutting back more. Not interested in quitting, reports she understands risks of cancer, CV disease, etc  ROS: See pertinent positives and negatives per HPI.  Past Medical History  Diagnosis Date  . Counseling for hormone replacement therapy     started replacement therapy on 10/21/11  . Hypertension   . Gender dysphoria - managed by Dr. Ruby ColaPittaway, reproductive gynecology 12/28/2013  . Hormone replacement therapy - managed by Dr. Ruby ColaPittaway 12/28/2013  . Tobacco use disorder 12/28/2013  . Leukocytosis 12/28/2013  . Esophageal reflux 12/28/2013    Past Surgical History  Procedure Laterality Date  . Tonsillectomy    . Breast enhancement surgery  09/2013    Family History  Problem Relation Age of Onset  . Hypertension Father   . Hypertension Paternal Grandfather     Social History   Social History  . Marital Status: Married    Spouse Name: N/A  . Number of Children: N/A  . Years of Education: N/A   Social History Main Topics  .  Smoking status: Current Every Day Smoker -- 0.50 packs/day    Types: Cigarettes  . Smokeless tobacco: Never Used  . Alcohol Use: 1.0 oz/week    2 drink(s) per week     Comment: occasionally  . Drug Use: No  . Sexual Activity: Yes    Birth Control/ Protection: None   Other Topics Concern  . None   Social History Narrative   Work or School: Systems developeranalyst for time [employer]      Home Situation: lives with husband/partner. Gender dysphoria.      Spiritual Beliefs: none      Lifestyle: no regular exercise; diet is ok              Current outpatient prescriptions:  .  aspirin EC 81 MG tablet, Take 81 mg by mouth daily., Disp: , Rfl:  .  estradiol (ESTRACE) 2 MG tablet, Take 2 mg by mouth 3 (three) times daily. , Disp: , Rfl:  .  lisinopril (PRINIVIL,ZESTRIL) 10 MG tablet, Take 1 tablet (10 mg total) by mouth daily., Disp: 90 tablet, Rfl: 3 .  metoprolol (LOPRESSOR) 50 MG tablet, Take 1 tablet (50 mg total) by mouth 2 (two) times daily., Disp: 120 tablet, Rfl: 3 .  omeprazole (PRILOSEC) 20 MG capsule, Take 1 capsule (20 mg total) by mouth daily., Disp: 30 capsule, Rfl: 0 .  spironolactone-hydrochlorothiazide (ALDACTAZIDE) 25-25 MG per tablet, Take 1 tablet by mouth daily., Disp: 30 tablet, Rfl: 0  EXAM:  Filed Vitals:   12/22/14 1307  BP: 122/80  Pulse: 90  Temp: 98.6 F (37  C)    Body mass index is 24.32 kg/(m^2).  GENERAL: vitals reviewed and listed above, alert, oriented, appears well hydrated and in no acute distress  HEENT: atraumatic, conjunttiva clear, no obvious abnormalities on inspection of external nose and ears  NECK: no obvious masses on inspection  LUNGS: clear to auscultation bilaterally, no wheezes, rales or rhonchi, good air movement  CV: HRRR, no peripheral edema  MS: moves all extremities without noticeable abnormality  PSYCH: pleasant and cooperative, no obvious depression or anxiety  ASSESSMENT AND PLAN:  Discussed the following assessment and  plan:  Essential hypertension - Plan: Basic metabolic panel  Tobacco use disorder  Leukocytosis - Plan: CBC with Differential  Hormone replacement therapy - managed by Dr. Ruby Cola  -check labs, if wbcs remain elevated advised onc consult -advised healthy lifestyle -advised to quit smoking, advised of sig risks and offered help -Patient advised to return or notify a doctor immediately if symptoms worsen or persist or new concerns arise.  Patient Instructions  BEFORE YOU LEAVE: -labs -schedule annual preventive visit in 4-6 months  We recommend the following healthy lifestyle measures: - eat a healthy whole foods diet consisting of regular small meals composed of vegetables, fruits, beans, nuts, seeds, healthy meats such as white chicken and fish and whole grains.  - avoid sweets, white starchy foods, fried foods, fast food, processed foods, sodas, red meet and other fattening foods.  - get a least 150-300 minutes of aerobic exercise per week.       Kriste Basque R.

## 2014-12-22 NOTE — Progress Notes (Signed)
Pre visit review using our clinic review tool, if applicable. No additional management support is needed unless otherwise documented below in the visit note. 

## 2014-12-22 NOTE — Patient Instructions (Signed)
BEFORE YOU LEAVE: -labs -schedule annual preventive visit in 4-6 months  We recommend the following healthy lifestyle measures: - eat a healthy whole foods diet consisting of regular small meals composed of vegetables, fruits, beans, nuts, seeds, healthy meats such as white chicken and fish and whole grains.  - avoid sweets, white starchy foods, fried foods, fast food, processed foods, sodas, red meet and other fattening foods.  - get a least 150-300 minutes of aerobic exercise per week.

## 2015-03-14 ENCOUNTER — Emergency Department (HOSPITAL_COMMUNITY)
Admission: EM | Admit: 2015-03-14 | Discharge: 2015-03-15 | Disposition: A | Discharge status: Home / Self Care | Payer: Managed Care, Other (non HMO) | Attending: Emergency Medicine | Admitting: Emergency Medicine

## 2015-03-14 ENCOUNTER — Encounter (HOSPITAL_COMMUNITY): Payer: Self-pay | Admitting: Emergency Medicine

## 2015-03-14 ENCOUNTER — Emergency Department (HOSPITAL_COMMUNITY): Payer: Managed Care, Other (non HMO)

## 2015-03-14 DIAGNOSIS — Z79818 Long term (current) use of other agents affecting estrogen receptors and estrogen levels: Secondary | ICD-10-CM | POA: Diagnosis not present

## 2015-03-14 DIAGNOSIS — K219 Gastro-esophageal reflux disease without esophagitis: Secondary | ICD-10-CM | POA: Diagnosis not present

## 2015-03-14 DIAGNOSIS — K5100 Ulcerative (chronic) pancolitis without complications: Secondary | ICD-10-CM

## 2015-03-14 DIAGNOSIS — Z79899 Other long term (current) drug therapy: Secondary | ICD-10-CM | POA: Diagnosis not present

## 2015-03-14 DIAGNOSIS — R110 Nausea: Secondary | ICD-10-CM | POA: Diagnosis not present

## 2015-03-14 DIAGNOSIS — F17210 Nicotine dependence, cigarettes, uncomplicated: Secondary | ICD-10-CM | POA: Diagnosis not present

## 2015-03-14 DIAGNOSIS — I10 Essential (primary) hypertension: Secondary | ICD-10-CM | POA: Insufficient documentation

## 2015-03-14 DIAGNOSIS — R634 Abnormal weight loss: Secondary | ICD-10-CM

## 2015-03-14 DIAGNOSIS — R630 Anorexia: Secondary | ICD-10-CM | POA: Diagnosis not present

## 2015-03-14 DIAGNOSIS — Z862 Personal history of diseases of the blood and blood-forming organs and certain disorders involving the immune mechanism: Secondary | ICD-10-CM | POA: Insufficient documentation

## 2015-03-14 DIAGNOSIS — R1084 Generalized abdominal pain: Secondary | ICD-10-CM

## 2015-03-14 LAB — URINE MICROSCOPIC-ADD ON: RBC / HPF: NONE SEEN RBC/hpf (ref 0–5)

## 2015-03-14 LAB — URINALYSIS, ROUTINE W REFLEX MICROSCOPIC
Bilirubin Urine: NEGATIVE
GLUCOSE, UA: NEGATIVE mg/dL
HGB URINE DIPSTICK: NEGATIVE
Ketones, ur: NEGATIVE mg/dL
LEUKOCYTES UA: NEGATIVE
Nitrite: NEGATIVE
PH: 6 (ref 5.0–8.0)
Protein, ur: 30 mg/dL — AB
Specific Gravity, Urine: 1.027 (ref 1.005–1.030)

## 2015-03-14 LAB — COMPREHENSIVE METABOLIC PANEL
ALBUMIN: 4.4 g/dL (ref 3.5–5.0)
ALT: 31 U/L (ref 14–54)
AST: 80 U/L — AB (ref 15–41)
Alkaline Phosphatase: 95 U/L (ref 38–126)
Anion gap: 15 (ref 5–15)
BUN: 12 mg/dL (ref 6–20)
CHLORIDE: 103 mmol/L (ref 101–111)
CO2: 25 mmol/L (ref 22–32)
Calcium: 9 mg/dL (ref 8.9–10.3)
Creatinine, Ser: 0.67 mg/dL (ref 0.44–1.00)
GFR calc Af Amer: >60 mL/min (ref >60)
Glucose, Bld: 97 mg/dL (ref 65–99)
POTASSIUM: 3.2 mmol/L — AB (ref 3.5–5.1)
SODIUM: 143 mmol/L (ref 135–145)
Total Bilirubin: 0.5 mg/dL (ref 0.3–1.2)
Total Protein: 7.6 g/dL (ref 6.5–8.1)

## 2015-03-14 LAB — CBC
HEMATOCRIT: 40.3 % (ref 36.0–46.0)
Hemoglobin: 14.5 g/dL (ref 12.0–15.0)
MCH: 34.9 pg — AB (ref 26.0–34.0)
MCHC: 36 g/dL (ref 30.0–36.0)
MCV: 96.9 fL (ref 78.0–100.0)
Platelets: 209 10*3/uL (ref 150–400)
RBC: 4.16 MIL/uL (ref 3.87–5.11)
RDW: 13.4 % (ref 11.5–15.5)
WBC: 6.7 10*3/uL (ref 4.0–10.5)

## 2015-03-14 LAB — LIPASE, BLOOD: LIPASE: 33 U/L (ref 11–51)

## 2015-03-14 MED ORDER — ONDANSETRON HCL 4 MG/2ML IJ SOLN
4.0000 mg | Freq: Once | INTRAMUSCULAR | Status: AC
  Administered 2015-03-14: 4 mg via INTRAVENOUS
  Filled 2015-03-14: qty 2

## 2015-03-14 MED ORDER — IOHEXOL 300 MG/ML  SOLN: 25.0000 mL | Freq: Once | INTRAMUSCULAR | Status: DC | PRN

## 2015-03-14 MED ORDER — SODIUM CHLORIDE 0.9 % IV BOLUS (SEPSIS)
1000.0000 mL | Freq: Once | INTRAVENOUS | Status: AC
  Administered 2015-03-14: 1000 mL via INTRAVENOUS

## 2015-03-14 NOTE — ED Provider Notes (Signed)
CSN: [id number]     Arrival date & time 03/14/15  2021 History   First MD Initiated Contact with Patient 03/14/15 2131     Chief Complaint  Patient presents with  . Abdominal Pain     (Consider location/radiation/quality/duration/timing/severity/associated sxs/prior Treatment) HPI 30 year old female who presents with nausea and generalized abdominal pain. History of gender reassignment surgery from female to female, hypertension, and GERD. States that she has been having progressive weight gain and decreased appetite since May 2016. The patient's wife states that she has had over a 30 pound weight loss over the course of the past 2-3 months. States that she has been evaluated in the past for abdominal pain, and at one time was told that she had a mass in her abdomen. Was told to follow-up, but states that she has not followed up regarding further workup of this. She states intermittent episodes of nausea and vomiting after eating. Currently with left-sided abdominal pain, but denies any post prandial abdominal pain typically. States that she often does not feel like eating because it makes her feel nauseous, but denies early satiety. No fevers, chills, melena, hematochezia, constipation, flank pain, or urinary complaints. States having watery stools but in setting of not eating recently.   Past Medical History  Diagnosis Date  . Counseling for hormone replacement therapy     started replacement therapy on 10/21/11  . Hypertension   . Gender dysphoria - managed by Dr. Ruby Cola, reproductive gynecology 12/28/2013  . Hormone replacement therapy - managed by Dr. Ruby Cola 12/28/2013  . Tobacco use disorder 12/28/2013  . Leukocytosis 12/28/2013  . Esophageal reflux 12/28/2013   Past Surgical History  Procedure Laterality Date  . Tonsillectomy    . Breast enhancement surgery  09/2013   Family History  Problem Relation Age of Onset  . Hypertension Father   . Hypertension Paternal Grandfather     Social History  Substance Use Topics  . Smoking status: Current Every Day Smoker -- 0.50 packs/day    Types: Cigarettes  . Smokeless tobacco: Never Used  . Alcohol Use: 1.0 oz/week    2 drink(s) per week     Comment: occasionally   OB History    No data available     Review of Systems 10/14 systems reviewed and are negative other than those stated in the HPI    Allergies  Review of patient's allergies indicates no known allergies.  Home Medications   Prior to Admission medications   Medication Sig Start Date End Date Taking? Authorizing Provider  estradiol (ESTRACE) 2 MG tablet Take 2 mg by mouth 2 (two) times daily.    Yes Historical Provider, MD  lisinopril (PRINIVIL,ZESTRIL) 10 MG tablet Take 1 tablet (10 mg total) by mouth daily. 12/22/14  Yes Terressa Koyanagi, DO  metoprolol (LOPRESSOR) 50 MG tablet Take 1 tablet (50 mg total) by mouth 2 (two) times daily. 12/22/14  Yes Terressa Koyanagi, DO  omeprazole (PRILOSEC) 20 MG capsule Take 1 capsule (20 mg total) by mouth daily. Patient taking differently: Take 20 mg by mouth daily as needed (heartburn and acid reflux).  10/03/14  Yes Terressa Koyanagi, DO  spironolactone-hydrochlorothiazide (ALDACTAZIDE) 25-25 MG per tablet Take 1 tablet by mouth daily. 10/03/14  Yes Hannah R Kim, DO   BP 141/100 mmHg  Pulse 97  Temp(Src) 98.8 F (37.1 C) (Oral)  Resp 18  SpO2 97% Physical Exam Physical Exam  Nursing note and vitals reviewed. Constitutional: Well developed, well nourished, non-toxic, and in  no acute distress Head: Normocephalic and atraumatic.  Mouth/Throat: Oropharynx is clear. Mucous membranes appear dry Neck: Normal range of motion. Neck supple.  Cardiovascular: Tachycardic rate and regular rhythm.   Pulmonary/Chest: Effort normal and breath sounds normal.  Abdominal: Soft. There is left sided abdominal tenderness. There is no rebound and no guarding.  Musculoskeletal: Normal range of motion.  Neurological: Alert, no facial  droop, fluent speech, moves all extremities symmetrically Skin: Skin is warm and dry.  Psychiatric: Cooperative  ED Course  Procedures (including critical care time) Labs Review Labs Reviewed  COMPREHENSIVE METABOLIC PANEL - Abnormal; Notable for the following:    Potassium 3.2 (*)    AST 80 (*)    All other components within normal limits  CBC - Abnormal; Notable for the following:    MCH 34.9 (*)    All other components within normal limits  URINALYSIS, ROUTINE W REFLEX MICROSCOPIC (NOT AT [facility]) - Abnormal; Notable for the following:    Protein, ur 30 (*)    All other components within normal limits  URINE MICROSCOPIC-ADD ON - Abnormal; Notable for the following:    Squamous Epithelial / LPF 0-5 (*)    Bacteria, UA RARE (*)    All other components within normal limits  LIPASE, BLOOD    Imaging Review No results found. I have personally reviewed and evaluated these images and lab results as part of my medical decision-making.   EKG Interpretation None      MDM   Final diagnoses:  Generalized abdominal pain  Weight loss    30 year old female who presents with weight loss, decreased appetite, and intermittent abdominal pain. Appears dry on exam and mildly tachycardia, responsive to fluids. Soft and nonsurgical abdomen with left hemi-abdominal tenderness. Mild hypokalemia on blood work but no AKI or evidence of severe dehydration. UA unremarkable. Will plan for CT abd/pelvis to evaluate for mass.   CT visualized. No mass but pancolitis, likely infectious more than inflammatory per radiology. No recent antibiotics or travel. No bloody diarrhea. Given trial of antibiotics and discussed need for GI follow-up for endoscopy. Given referral. Strict return and follow-up instructions reviewed. She/He expressed understanding of all discharge instructions and felt comfortable with the plan of care.     Lavera Guiseana Duo Jean Alejos, MD 03/15/15 1420

## 2015-03-14 NOTE — ED Notes (Signed)
Patient advised finished drinking contrast

## 2015-03-14 NOTE — ED Notes (Signed)
Pt states that she has lost 30lbs in the past month after constant abdominal pain and N/V. States she had an US approx. 1.5 years ago that showed some type of mass that she never f/u on. Alert and oriented.

## 2015-03-15 ENCOUNTER — Encounter (HOSPITAL_COMMUNITY): Payer: Self-pay

## 2015-03-15 MED ORDER — ONDANSETRON HCL 4 MG PO TABS
4.0000 mg | ORAL_TABLET | Freq: Three times a day (TID) | ORAL | Status: AC | PRN
Start: 2015-03-15 — End: ?

## 2015-03-15 MED ORDER — CIPROFLOXACIN HCL 500 MG PO TABS: 500.0000 mg | ORAL_TABLET | Freq: Two times a day (BID) | ORAL | Status: DC

## 2015-03-15 MED ORDER — IOHEXOL 300 MG/ML  SOLN
100.0000 mL | Freq: Once | INTRAMUSCULAR | Status: AC | PRN
  Administered 2015-03-15: 100 mL via INTRAVENOUS

## 2015-03-15 NOTE — Discharge Instructions (Signed)
Return for worsening symptoms, including fever, worsening pain, vomiting and unable to keep down food/fluids, or any other symptoms concerning to you. Please call GI to arrange follow-up for possible endoscopy and further work-up.   Abdominal Pain, Adult Many things can cause belly (abdominal) pain. Most times, the belly pain is not dangerous. Many cases of belly pain can be watched and treated at home. HOME CARE   Do not take medicines that help you go poop (laxatives) unless told to by your doctor.  Only take medicine as told by your doctor.  Eat or drink as told by your doctor. Your doctor will tell you if you should be on a special diet. GET HELP IF:  You do not know what is causing your belly pain.  You have belly pain while you are sick to your stomach (nauseous) or have runny poop (diarrhea).  You have pain while you pee or poop.  Your belly pain wakes you up at night.  You have belly pain that gets worse or better when you eat.  You have belly pain that gets worse when you eat fatty foods.  You have a fever. GET HELP RIGHT AWAY IF:   The pain does not go away within 2 hours.  You keep throwing up (vomiting).  The pain changes and is only in the right or left part of the belly.  You have bloody or tarry looking poop. MAKE SURE YOU:   Understand these instructions.  Will watch your condition.  Will get help right away if you are not doing well or get worse.   This information is not intended to replace advice given to you by your health care provider. Make sure you discuss any questions you have with your health care provider.   Document Released: 08/07/2007 Document Revised: 03/11/2014 Document Reviewed: 10/28/2012 Elsevier Interactive Patient Education [employer]2016 Elsevier Inc.

## 2015-03-15 NOTE — ED Notes (Signed)
Patient transported to CT 

## 2015-03-15 NOTE — ED Notes (Signed)
Pt back from CT

## 2015-03-15 NOTE — ED Notes (Signed)
Patient d/c'd self care.  F/U and medications discussed.  Patient verbalized understanding. 

## 2015-03-20 ENCOUNTER — Ambulatory Visit (INDEPENDENT_AMBULATORY_CARE_PROVIDER_SITE_OTHER): Payer: Managed Care, Other (non HMO) | Admitting: Family Medicine

## 2015-03-20 DIAGNOSIS — R69 Illness, unspecified: Secondary | ICD-10-CM

## 2015-03-20 NOTE — Progress Notes (Signed)
NO SHOW

## 2015-03-27 ENCOUNTER — Ambulatory Visit (INDEPENDENT_AMBULATORY_CARE_PROVIDER_SITE_OTHER): Payer: Managed Care, Other (non HMO) | Admitting: Family Medicine

## 2015-03-27 ENCOUNTER — Encounter: Payer: Self-pay | Admitting: Family Medicine

## 2015-03-27 VITALS — BP 100/80 | HR 115 | Temp 98.7°F | Ht 66.0 in | Wt 137.1 lb

## 2015-03-27 DIAGNOSIS — K529 Noninfective gastroenteritis and colitis, unspecified: Secondary | ICD-10-CM | POA: Diagnosis not present

## 2015-03-27 DIAGNOSIS — R194 Change in bowel habit: Secondary | ICD-10-CM | POA: Diagnosis not present

## 2015-03-27 DIAGNOSIS — R634 Abnormal weight loss: Secondary | ICD-10-CM

## 2015-03-27 NOTE — Patient Instructions (Signed)
-  We placed a referral for you as discussed. It usually takes about 1-2 weeks to process and schedule this referral. If you have not heard from [place] regarding this appointment in 2 weeks please contact our office.  -seek care immediately if worsening or no concerns  - no alcohol

## 2015-03-27 NOTE — Progress Notes (Signed)
HPI:  Yvonne Harrell is a pleasant 30 yo with a PMH significant for HTN,  gender dysphoria s/p female to female gender reassignment surgery, tobacco use and GERD here for a follow up after a recent ER visit. Pt went to the ER 1/10 /17 for abd pain and weight loss for > 1 month and 30lb wt loss. CT showed pancolitis. Was treated with antibiotics and per notes was referred to GI - however no referral was done. Pt has been on cipro. Just finishing. Reports no diarrhea in several days - but still with looser BMs then is her normal. No stool studies done. No showed visit here last week. Denies: fevers, chills, malaise, melena, hematochezia.  ROS: See pertinent positives and negatives per HPI.  Past Medical History  Diagnosis Date  . Counseling for hormone replacement therapy     started replacement therapy on 10/21/11  . Hypertension   . Gender dysphoria - managed by Dr. Ruby Cola, reproductive gynecology 12/28/2013  . Hormone replacement therapy - managed by Dr. Ruby Cola 12/28/2013  . Tobacco use disorder 12/28/2013  . Leukocytosis 12/28/2013  . Esophageal reflux 12/28/2013    Past Surgical History  Procedure Laterality Date  . Tonsillectomy    . Breast enhancement surgery  09/2013    Family History  Problem Relation Age of Onset  . Hypertension Father   . Hypertension Paternal Grandfather     Social History   Social History  . Marital Status: Married    Spouse Name: N/A  . Number of Children: N/A  . Years of Education: N/A   Social History Main Topics  . Smoking status: Current Every Day Smoker -- 0.50 packs/day    Types: Cigarettes  . Smokeless tobacco: Never Used  . Alcohol Use: 1.0 oz/week    2 drink(s) per week     Comment: occasionally  . Drug Use: No  . Sexual Activity: Yes    Birth Control/ Protection: None   Other Topics Concern  . 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 Gender  dysphoria.      Spiritual Beliefs: none      Lifestyle: no regular exercise; diet is ok              Current outpatient prescriptions:  .  estradiol (ESTRACE) 2 MG tablet, Take 2 mg by mouth 2 (two) times daily. , Disp: , Rfl:  .  lisinopril (PRINIVIL,ZESTRIL) 10 MG tablet, Take 1 tablet (10 mg total) by mouth daily., Disp: 90 tablet, Rfl: 3 .  metoprolol (LOPRESSOR) 50 MG tablet, Take 1 tablet (50 mg total) by mouth 2 (two) times daily., Disp: 120 tablet, Rfl: 3 .  omeprazole (PRILOSEC) 20 MG capsule, Take 1 capsule (20 mg total) by mouth daily. (Patient taking differently: Take 20 mg by mouth daily as needed (heartburn and acid reflux). ), Disp: 30 capsule, Rfl: 0 .  ondansetron (ZOFRAN) 4 MG tablet, Take 1 tablet (4 mg total) by mouth every 8 (eight) hours as needed for nausea or vomiting., Disp: 12 tablet, Rfl: 0 .  spironolactone-hydrochlorothiazide (ALDACTAZIDE) 25-25 MG per tablet, Take 1 tablet by mouth daily., Disp: 30 tablet, Rfl: 0  EXAM:  Filed Vitals:   03/27/15 1508  BP: 100/80  Pulse: 115  Temp: 98.7 F (37.1 C)    Body mass index is 22.14 kg/(m^2).  GENERAL: vitals reviewed and listed above, alert, oriented, appears well hydrated and in  no acute distress  HEENT: atraumatic, conjunttiva clear, no obvious abnormalities on inspection of external nose and ears  NECK: no obvious masses on inspection  LUNGS: clear to auscultation bilaterally, no wheezes, rales or rhonchi, good air movement  CV: HRRR, no peripheral edema  ABD: BS+, soft, mild difuse TTP without rebound or guarding  MS: moves all extremities without noticeable abnormality  PSYCH: pleasant and cooperative, no obvious depression or anxiety  ASSESSMENT AND PLAN:  Discussed the following assessment and plan:  Colitis - Plan: Ambulatory referral to Gastroenterology  Loss of weight - Plan: Ambulatory referral to Gastroenterology  Change in bowel habits - Plan: Ambulatory referral to  Gastroenterology  -seems that symptoms have improved, but given duration of symptoms, degree of weight loss, extent of findings on scan and persistent mild abd pain do feel eval with GI warrented -referral placed -return and ER precautions -Patient advised to return or notify a doctor immediately if symptoms worsen or persist or new concerns arise.  Patient Instructions  -We placed a referral for you as discussed. It usually takes about 1-2 weeks to process and schedule this referral. If you have not heard from [place] regarding this appointment in 2 weeks please contact our office.  -seek care immediately if worsening or no concerns  - no alcohol     KIM, HANNAH R.

## 2015-03-27 NOTE — Progress Notes (Signed)
Pre visit review using our clinic review tool, if applicable. No additional management support is needed unless otherwise documented below in the visit note. 

## 2015-03-28 ENCOUNTER — Encounter: Payer: Self-pay | Admitting: Physician Assistant

## 2015-04-24 ENCOUNTER — Encounter: Payer: Self-pay | Admitting: Physician Assistant

## 2015-04-24 ENCOUNTER — Other Ambulatory Visit (INDEPENDENT_AMBULATORY_CARE_PROVIDER_SITE_OTHER): Payer: Managed Care, Other (non HMO)

## 2015-04-24 ENCOUNTER — Ambulatory Visit (INDEPENDENT_AMBULATORY_CARE_PROVIDER_SITE_OTHER): Payer: Managed Care, Other (non HMO) | Admitting: Physician Assistant

## 2015-04-24 ENCOUNTER — Other Ambulatory Visit: Payer: Self-pay | Admitting: Emergency Medicine

## 2015-04-24 VITALS — BP 118/80 | HR 82 | Ht 66.75 in | Wt 139.8 lb

## 2015-04-24 DIAGNOSIS — K760 Fatty (change of) liver, not elsewhere classified: Secondary | ICD-10-CM | POA: Diagnosis not present

## 2015-04-24 DIAGNOSIS — K219 Gastro-esophageal reflux disease without esophagitis: Secondary | ICD-10-CM

## 2015-04-24 DIAGNOSIS — K591 Functional diarrhea: Secondary | ICD-10-CM

## 2015-04-24 DIAGNOSIS — K529 Noninfective gastroenteritis and colitis, unspecified: Secondary | ICD-10-CM

## 2015-04-24 LAB — CBC WITH DIFFERENTIAL/PLATELET
Basophils Absolute: 0 10*3/uL (ref 0.0–0.1)
Basophils Relative: 0.5 % (ref 0.0–3.0)
EOS PCT: 1.7 % (ref 0.0–5.0)
Eosinophils Absolute: 0.1 10*3/uL (ref 0.0–0.7)
HCT: 35.7 % — ABNORMAL LOW (ref 36.0–46.0)
Hemoglobin: 12.3 g/dL (ref 12.0–15.0)
LYMPHS ABS: 2 10*3/uL (ref 0.7–4.0)
Lymphocytes Relative: 28.7 % (ref 12.0–46.0)
MCHC: 34.3 g/dL (ref 30.0–36.0)
MCV: 98.7 fl (ref 78.0–100.0)
MONO ABS: 0.4 10*3/uL (ref 0.1–1.0)
MONOS PCT: 6.2 % (ref 3.0–12.0)
NEUTROS ABS: 4.3 10*3/uL (ref 1.4–7.7)
NEUTROS PCT: 62.9 % (ref 43.0–77.0)
PLATELETS: 292 10*3/uL (ref 150.0–400.0)
RBC: 3.62 Mil/uL — ABNORMAL LOW (ref 3.87–5.11)
RDW: 14.4 % (ref 11.5–15.5)
WBC: 6.9 10*3/uL (ref 4.0–10.5)

## 2015-04-24 LAB — HEPATIC FUNCTION PANEL
ALBUMIN: 4.2 g/dL (ref 3.5–5.2)
ALT: 12 U/L (ref 0–35)
AST: 21 U/L (ref 0–37)
Alkaline Phosphatase: 85 U/L (ref 39–117)
BILIRUBIN TOTAL: 0.4 mg/dL (ref 0.2–1.2)
Bilirubin, Direct: 0.1 mg/dL (ref 0.0–0.3)
Total Protein: 7.2 g/dL (ref 6.0–8.3)

## 2015-04-24 LAB — IGA: IGA: 197 mg/dL (ref 68–378)

## 2015-04-24 MED ORDER — NA SULFATE-K SULFATE-MG SULF 17.5-3.13-1.6 GM/177ML PO SOLN: 1.0000 | Freq: Once | ORAL | Status: DC

## 2015-04-24 MED ORDER — PANTOPRAZOLE SODIUM 40 MG PO TBEC: 40.0000 mg | DELAYED_RELEASE_TABLET | Freq: Two times a day (BID) | ORAL | Status: DC

## 2015-04-24 NOTE — Patient Instructions (Signed)
Your physician has requested that you go to the basement for lab work before leaving today.  You have been scheduled for a colonoscopy. Please follow written instructions given to you at your visit today.  Please pick up your prep supplies at the pharmacy within the next 1-3 days. If you use inhalers (even only as needed), please bring them with you on the day of your procedure. Your physician has requested that you go to www.startemmi.com and enter the access code given to you at your visit today. This web site gives a general overview about your procedure. However, you should still follow specific instructions given to you by our office regarding your preparation for the procedure.  Stop Omeprazole.   We have sent the following medications to your pharmacy for you to pick up at your convenience: Pantoprazole 20 mg twice a day

## 2015-04-24 NOTE — Progress Notes (Signed)
Patient ID: Yvonne Harrell, female   DOB: 01-05-1986, 30 y.o.   MRN: [id number]    HPI:  Yvonne Harrell is a 30 y.o.   female  referred by Terressa Koyanagi, DO and abnormal colonic findings on CT. 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 She also has a history of tobacco abuse, hypertension, and GERD. She was seen in the emergency room on 03/14/2015 for abdominal pain, diarrhea, and a 30 pound weight loss over one to one and a half months. CT showed pancolitis. She was treated with several days of Cipro and feels her diarrhea has improved. She is now having 2-4 mushy to watery bowel movements daily. She has no nocturnal stooling. She has no bright red blood per rectum or melena. She denies tenesmus or sensation of incomplete evacuation. She had had an episode of diarrhea like this several months prior, but it resolved on its own. She has not had any travel outside of the country. She has city water. She does not have any new pets. She had not had any recent courses of antibiotics prior to the onset of her diarrhea. She denies a family history of colon cancer, colon polyps, or inflammatory bowel disease. She is unaware of a family history of celiac disease.  She also states that she has heartburn on a fairly regular basis. She has been using omeprazole, but despite this has been getting breakthrough heartburn. She denies epigastric pain, nausea, vomiting, dysphagia, odynophagia, or early satiety. Her laboratory studies show that she has an elevated AST. She had a CT of the abdomen and pelvis in 2013 that showed fatty infiltration of the liver. She states she has a drink once or twice a week but never more frequently than that and she does not drink to excess. She has no prior history of hepatitis.   Past Medical History  Diagnosis Date  . Counseling for hormone replacement therapy     started replacement therapy on 10/21/11  . Hypertension   . Gender  dysphoria - managed by Dr. Ruby Cola, reproductive gynecology 12/28/2013  . Hormone replacement therapy - managed by Dr. Ruby Cola 12/28/2013  . Tobacco use disorder 12/28/2013  . Leukocytosis 12/28/2013  . Esophageal reflux 12/28/2013    Past Surgical History  Procedure Laterality Date  . Tonsillectomy    . Breast enhancement surgery  09/2013   Family History  Problem Relation Age of Onset  . Hypertension Father   . Hypertension Paternal Grandfather    Social History  Substance Use Topics  . Smoking status: Current Every Day Smoker -- 0.50 packs/day    Types: Cigarettes  . Smokeless tobacco: Never Used  . Alcohol Use: 1.0 oz/week    2 drink(s) per week     Comment: occasionally   Current Outpatient Prescriptions  Medication Sig Dispense Refill  . estradiol (ESTRACE) 2 MG tablet Take 2 mg by mouth 2 (two) times daily.     Marland Kitchen lisinopril (PRINIVIL,ZESTRIL) 10 MG tablet Take 1 tablet (10 mg total) by mouth daily. 90 tablet 3  . metoprolol (LOPRESSOR) 50 MG tablet Take 1 tablet (50 mg total) by mouth 2 (two) times daily. 120 tablet 3  . omeprazole (PRILOSEC) 20 MG capsule Take 1 capsule (20 mg total) by mouth daily. (Patient taking differently: Take 20 mg by mouth daily as needed (heartburn and acid reflux). ) 30 capsule 0  . spironolactone-hydrochlorothiazide (ALDACTAZIDE) 25-25 MG per tablet Take 1 tablet by mouth  daily. 30 tablet 0  . ondansetron (ZOFRAN) 4 MG tablet Take 1 tablet (4 mg total) by mouth every 8 (eight) hours as needed for nausea or vomiting. (Patient not taking: Reported on 04/24/2015) 12 tablet 0  . pantoprazole (PROTONIX) 40 MG tablet Take 1 tablet (40 mg total) by mouth 2 (two) times daily. 180 tablet 3  . pantoprazole (PROTONIX) 40 MG tablet Take 1 tablet (40 mg total) by mouth 2 (two) times daily. 180 tablet 3   No current facility-administered medications for this visit.   No Known Allergies   Review of Systems: Gen: Denies any fever, chills, sweats,  anorexia, fatigue, weakness, malaise, and sleep disorder CV: Denies chest pain, angina, palpitations, syncope, orthopnea, PND, peripheral edema, and claudication. Resp: Denies dyspnea at rest, dyspnea with exercise, cough, sputum, wheezing, coughing up blood, and pleurisy. GI: Denies vomiting blood, jaundice, and fecal incontinence.   Denies dysphagia or odynophagia. GU : Denies urinary burning, blood in urine, urinary frequency, urinary hesitancy, nocturnal urination, and urinary incontinence. MS: Denies joint pain, limitation of movement, and swelling, stiffness, low back pain, extremity pain. Denies muscle weakness, cramps, atrophy.  Derm: Denies rash, itching, dry skin, hives, moles, warts, or unhealing ulcers.  Psych: Denies depression, anxiety, memory loss, suicidal ideation, hallucinations, paranoia, and confusion. Heme: Denies bruising, bleeding, and enlarged lymph nodes. Neuro:  Denies any headaches, dizziness, paresthesias. Endo:  Denies any problems with DM, thyroid, adrenal function  Studies: Abdomen Pelvis W Contrast     Study Result     CLINICAL DATA: 30 year old female with weight loss and abdominal pain. History of gastric mass.  EXAM: CT ABDOMEN AND PELVIS WITH CONTRAST  TECHNIQUE: Multidetector CT imaging of the abdomen and pelvis was performed using the standard protocol following bolus administration of intravenous contrast.  CONTRAST: OMNIPAQUE IOHEXOL 300 MG/ML SOLN  COMPARISON: CT dated 03/15/2011  FINDINGS: The visualized lung bases are clear. No intra-abdominal free air or free fluid.  Diffuse hepatic steatosis. The gallbladder, pancreas, spleen, adrenal glands, kidneys, visualized ureters, and urinary bladder appear unremarkable. Subcentimeter left renal hypodensities are too small to characterize.  There is diffuse thickening and inflammatory changes of the wall of the colon compatible with pancolitis. Findings likely represent  and infectious colitis such as pseudomembranous colitis versus less likely ulcerative colitis. Correlation with history of recent antibiotic use and stool cultures recommended. There is no evidence of bowel obstruction. Normal appendix.  The abdominal aorta and IVC appear unremarkable. No portal venous gas identified. There is no adenopathy. The abdominal wall soft tissues appear unremarkable. Postsurgical changes of gender change reconstruction in the anterior pelvis. The osseous structures are intact. Bilateral breast implants are partially visualized.  IMPRESSION: Pancolitis.   Electronically Signed  By: Elgie Collard M.D.  On: 03/15/2015 01:24     LAB RESULTS: Blood work on 03/14/2015 showed CBC with WBC 6.7, hemoglobin 14.5, hematocrit 40.3, platelets 209,000. Chem profile on 03/14/2015 had total bili 0.5, alkaline phosphatase 95, ALT 31, AST 80. Lipase 33.   Physical Exam: BP 118/80 mmHg  Pulse 82  Ht 5' 6.75" (1.695 m)  Wt 139 lb 12.8 oz (63.413 kg)  BMI 22.07 kg/m2 Constitutional: Pleasant,well-developed,female in no acute distress. HEENT: Normocephalic and atraumatic. Conjunctivae are normal. No scleral icterus. Neck supple.  Cardiovascular: Normal rate, regular rhythm.  Pulmonary/chest: Effort normal and breath sounds normal. No wheezing, rales or rhonchi. Abdominal: Soft, nondistended, nontender. Bowel sounds active throughout. There are no masses palpable. No hepatomegaly. Extremities: no edema Lymphadenopathy: No cervical adenopathy  noted. Neurological: Alert and oriented to person place and time. Skin: Skin is warm and dry. No rashes noted. Psychiatric: Normal mood and affect. Behavior is normal.  ASSESSMENT AND PLAN: #1. Diarrhea and CT findings of pancolitis. Patient's diarrhea has slowed after several days of Cipro but she continues to have 2-4 mushy to loose bowel movements daily. We will recheck his CBC and check an IgA and TTG to evaluate for  possible celiac. We will check a stool for C. difficile. She will be scheduled for colonoscopy to assess for polyps, neoplasia, IBD, microscopic colitis, etc.The risks, benefits, and alternatives to colonoscopy with possible biopsy and possible polypectomy were discussed with the patient and they consent to proceed.  The procedure will be scheduled with Dr. Lavon Paganini  per patient request as patient prefers a female provider.  #2. GERD. An antireflux regimen has been reviewed. She will discontinue omeprazole and be given a trial of pantoprazole 40 mg by mouth twice a day.  #3. Fatty liver. Hepatic function panel will be obtained. She's been instructed to maintain her weight and started exercise program.  Further recommendations will be made pending the findings of the above.    Simona Rocque, Tollie Pizza PA-C 04/24/2015, 11:00 AM  CC: Terressa Koyanagi, DO

## 2015-04-25 LAB — TISSUE TRANSGLUTAMINASE, IGA: TISSUE TRANSGLUTAMINASE AB, IGA: 1 U/mL (ref <4)

## 2015-04-26 ENCOUNTER — Encounter: Payer: Self-pay | Admitting: Family Medicine

## 2015-04-26 ENCOUNTER — Ambulatory Visit (INDEPENDENT_AMBULATORY_CARE_PROVIDER_SITE_OTHER): Payer: Managed Care, Other (non HMO) | Admitting: Family Medicine

## 2015-04-26 VITALS — BP 102/80 | HR 112 | Temp 98.7°F | Ht 67.0 in | Wt 139.5 lb

## 2015-04-26 DIAGNOSIS — Z0000 Encounter for general adult medical examination without abnormal findings: Secondary | ICD-10-CM | POA: Diagnosis not present

## 2015-04-26 DIAGNOSIS — Z7289 Other problems related to lifestyle: Secondary | ICD-10-CM

## 2015-04-26 DIAGNOSIS — K219 Gastro-esophageal reflux disease without esophagitis: Secondary | ICD-10-CM | POA: Diagnosis not present

## 2015-04-26 DIAGNOSIS — Z789 Other specified health status: Secondary | ICD-10-CM

## 2015-04-26 DIAGNOSIS — Z79890 Hormone replacement therapy (postmenopausal): Secondary | ICD-10-CM

## 2015-04-26 DIAGNOSIS — D72829 Elevated white blood cell count, unspecified: Secondary | ICD-10-CM

## 2015-04-26 DIAGNOSIS — F17200 Nicotine dependence, unspecified, uncomplicated: Secondary | ICD-10-CM

## 2015-04-26 DIAGNOSIS — I10 Essential (primary) hypertension: Secondary | ICD-10-CM

## 2015-04-26 DIAGNOSIS — F1090 Alcohol use, unspecified, uncomplicated: Secondary | ICD-10-CM

## 2015-04-26 HISTORY — DX: Other specified health status: Z78.9

## 2015-04-26 HISTORY — DX: Other problems related to lifestyle: Z72.89

## 2015-04-26 HISTORY — DX: Alcohol use, unspecified, uncomplicated: F10.90

## 2015-04-26 LAB — CHOLESTEROL, TOTAL: CHOLESTEROL: 149 mg/dL (ref 0–200)

## 2015-04-26 LAB — BASIC METABOLIC PANEL
BUN: 9 mg/dL (ref 6–23)
CHLORIDE: 102 meq/L (ref 96–112)
CO2: 31 mEq/L (ref 19–32)
Calcium: 8.8 mg/dL (ref 8.4–10.5)
Creatinine, Ser: 0.58 mg/dL (ref 0.40–1.20)
GFR: 129.14 mL/min (ref >60.00)
Glucose, Bld: 76 mg/dL (ref 70–99)
POTASSIUM: 3.6 meq/L (ref 3.5–5.1)
SODIUM: 144 meq/L (ref 135–145)

## 2015-04-26 LAB — HDL CHOLESTEROL: HDL: 60.4 mg/dL (ref >39.00)

## 2015-04-26 NOTE — Progress Notes (Signed)
HPI:  Yvonne Harrell is a pleasant 30 yo, here for a physical. PMH includes gender dysmorphia s/p female to female gender reassignment surgery, tobacco use, HTN and GERD. She is seeing the gastroenterologist for colitis and currently is awaiting lab results and a colonoscopy, however she reports she is feeling better and her weight has stabilized. She smokes 4-5 cigs per day and is not currently interested in help quitting. She reports she drinks a few drinks a few times per week, denies more then 2-3 drinks in any given day and denies daily alcohol intake. Did not see hematologist about the prior leukocytosis but this was resolved on recent labs.  -Concerns and/or follow up today: none  -Diet: variety of foods, balance and well rounded, dose not eat meat, but does eat dairy and eggs  -Exercise: no regular exercise  -Taking folic acid, vitamin D or calcium: no  -Diabetes and Dyslipidemia Screening: labs today  -Hx of HTN: no  -Vaccines: refuses  -pap history:n/a  -FDLMP: n/a  -wants STI testing (Hep C if born [birth year]): no - reports has not been sexually active  -Alcohol, Tobacco, drug use: see social history  Review of Systems - no fevers, unintentional weight loss, vision loss, hearing loss, chest pain, sob, hemoptysis, melena, hematochezia, hematuria, genital discharge, changing or concerning skin lesions, bleeding, bruising, loc, thoughts of self harm or SI  Past Medical History  Diagnosis Date  . Counseling for hormone replacement therapy     started replacement therapy on 10/21/11  . Hypertension   . Gender dysphoria - managed by Dr. Vella Raring, reproductive gynecology 12/28/2013  . Hormone replacement therapy - managed by Dr. Vella Raring 12/28/2013  . Tobacco use disorder 12/28/2013  . Leukocytosis 12/28/2013  . Esophageal reflux 12/28/2013  . Colitis   . Alcohol use ([place]) 04/26/2015    Past Surgical History  Procedure Laterality Date  . Tonsillectomy    . Breast enhancement  surgery  09/2013  . Gender reassignment  2016    female to female    Family History  Problem Relation Age of Onset  . Hypertension Father   . Hypertension Paternal Grandfather     Social History   Social History  . Marital Status: Married    Spouse Name: N/A  . Number of Children: N/A  . Years of Education: N/A   Social History Main Topics  . Smoking status: Current Every Day Smoker -- 0.50 packs/day    Types: Cigarettes  . Smokeless tobacco: Never Used  . Alcohol Use: 1.0 oz/week    2 drink(s) per week     Comment: occasionally  . Drug Use: No  . Sexual Activity: Yes    Birth Control/ Protection: None   Other Topics Concern  . None   Social History Narrative   Work or School: Administrator for time [employer] Situation: lives with partner      Spiritual Beliefs: none      Lifestyle: no regular exercise; diet is ok              Current outpatient prescriptions:  .  estradiol (ESTRACE) 2 MG tablet, Take 2 mg by mouth 2 (two) times daily. , Disp: , Rfl:  .  lisinopril (PRINIVIL,ZESTRIL) 10 MG tablet, Take 1 tablet (10 mg total) by mouth daily., Disp: 90 tablet, Rfl: 3 .  metoprolol (LOPRESSOR) 50 MG tablet, Take 1 tablet (50 mg total) by mouth 2 (two) times daily., Disp: 120 tablet, Rfl: 3 .  Na Sulfate-K Sulfate-Mg Sulf SOLN, Take 1 kit by mouth once., Disp: 354 mL, Rfl: 0 .  omeprazole (PRILOSEC) 20 MG capsule, Take 1 capsule (20 mg total) by mouth daily. (Patient taking differently: Take 20 mg by mouth daily as needed (heartburn and acid reflux). ), Disp: 30 capsule, Rfl: 0 .  ondansetron (ZOFRAN) 4 MG tablet, Take 1 tablet (4 mg total) by mouth every 8 (eight) hours as needed for nausea or vomiting., Disp: 12 tablet, Rfl: 0 .  pantoprazole (PROTONIX) 40 MG tablet, Take 1 tablet (40 mg total) by mouth 2 (two) times daily., Disp: 180 tablet, Rfl: 3 .  spironolactone-hydrochlorothiazide (ALDACTAZIDE) 25-25 MG per tablet, Take 1 tablet by mouth daily., Disp: 30  tablet, Rfl: 0  EXAM:  Filed Vitals:   04/26/15 0832  BP: 102/80  Pulse: 112  Temp: 98.7 F (37.1 C)    GENERAL: vitals reviewed and listed below, alert, oriented, appears well hydrated and in no acute distress  HEENT: head atraumatic, PERRLA, normal appearance of eyes, ears, nose and mouth. moist mucus membranes.  NECK: supple, no masses or lymphadenopathy  LUNGS: clear to auscultation bilaterally, no rales, rhonchi or wheeze  CV: HRRR, no peripheral edema or cyanosis, normal pedal pulses  BREAST: normal appearance - no lesions or discharge, on palpation normal breast tissue without any suspicious masses  ABDOMEN: bowel sounds normal, soft, non tender to palpation, no masses, no rebound or guarding  GU: normal appearance of external genitalia - no lesions or masses, normal vaginal mucosa - no abnormal discharge, normal appearance of cervix - no lesions or abnormal discharge, no masses or tenderness on palpation of uterus and ovaries.  RECTAL: refused  SKIN: no rash or abnormal lesions  MS: normal gait, moves all extremities normally  NEURO: CN II-XII grossly intact, normal muscle strength and sensation to light touch on extremities  PSYCH: normal affect, pleasant and cooperative  ASSESSMENT AND PLAN:  Discussed the following assessment and plan:  Visit for preventive health examination - Plan: Cholesterol, Total, HDL cholesterol -Discussed and advised all [place] preventive services health task force level A and B recommendations for age, sex and risks. -Advised at least 150 minutes of exercise per week and a healthy diet low in saturated fats and sweets and consisting of fresh fruits and vegetables, lean meats such as fish and white chicken and whole grains. -labs, studies and vaccines per orders this encounter  Essential hypertension - Plan: Basic metabolic panel, CANCELED: CMP with eGFR -continue current therapy  Hormone replacement therapy - managed by Dr.  Vella Raring  Gastroesophageal reflux disease, esophagitis presence not specified  Tobacco use disorder -advised to quit, offered help, declined  Alcohol use ([place]) -advised to cut back, LFTs checked a few days ago with GI  Leukocytosis - Plan: CANCELED: CBC with Differential -resolved      Orders Placed This Encounter  Procedures  . Cholesterol, Total  . HDL cholesterol  . Basic metabolic panel    Patient advised to return to clinic immediately if symptoms worsen or persist or new concerns.  Patient Instructions  BEFORE YOU LEAVE: -labs -Schedule follow up in 4 months  -We have ordered labs or studies at this visit. It can take up to 1-2 weeks for results and processing. We will contact you with instructions IF your results are abnormal. Normal results will be released to your [facility]. If you have not heard from [place] or can not find your results in [facility] in 2 weeks please contact our office.  We recommend the following healthy lifestyle measures: - eat a healthy whole foods diet consisting of regular small meals composed of vegetables, fruits, beans, nuts, seeds, healthy meats such as white chicken and fish and whole grains.  - avoid sweets, white starchy foods, fried foods, fast food, processed foods, sodas, red meet and other fattening foods.  - get a least 150-300 minutes of aerobic exercise per week.   Vit D3 [phone] IU daily  Please quit smoking and cut back on alcohol intake.        No Follow-up on file.  Colin Benton R.

## 2015-04-26 NOTE — Progress Notes (Signed)
Pre visit review using our clinic review tool, if applicable. No additional management support is needed unless otherwise documented below in the visit note. 

## 2015-04-26 NOTE — Progress Notes (Signed)
Reviewed and agree with documentation and assessment and plan. K. Veena Valrie Jia , MD   

## 2015-04-26 NOTE — Patient Instructions (Addendum)
BEFORE YOU LEAVE: -labs -Schedule follow up in 4 months  -We have ordered labs or studies at this visit. It can take up to 1-2 weeks for results and processing. We will contact you with instructions IF your results are abnormal. Normal results will be released to your [facility]. If you have not heard from [place] or can not find your results in [facility] in 2 weeks please contact our office.  We recommend the following healthy lifestyle measures: - eat a healthy whole foods diet consisting of regular small meals composed of vegetables, fruits, beans, nuts, seeds, healthy meats such as white chicken and fish and whole grains.  - avoid sweets, white starchy foods, fried foods, fast food, processed foods, sodas, red meet and other fattening foods.  - get a least 150-300 minutes of aerobic exercise per week.   Vit D3 [phone] IU daily  Please quit smoking and cut back on alcohol intake.

## 2015-05-18 ENCOUNTER — Encounter: Payer: Self-pay | Admitting: Gastroenterology

## 2015-05-18 ENCOUNTER — Ambulatory Visit (AMBULATORY_SURGERY_CENTER): Payer: Managed Care, Other (non HMO) | Admitting: Gastroenterology

## 2015-05-18 VITALS — BP 142/98 | HR 104 | Temp 98.4°F | Resp 16 | Ht 67.0 in | Wt 139.0 lb

## 2015-05-18 DIAGNOSIS — R197 Diarrhea, unspecified: Secondary | ICD-10-CM | POA: Diagnosis not present

## 2015-05-18 DIAGNOSIS — R933 Abnormal findings on diagnostic imaging of other parts of digestive tract: Secondary | ICD-10-CM

## 2015-05-18 DIAGNOSIS — K529 Noninfective gastroenteritis and colitis, unspecified: Secondary | ICD-10-CM

## 2015-05-18 MED ORDER — SODIUM CHLORIDE 0.9 % IV SOLN: 500.0000 mL | INTRAVENOUS | Status: DC

## 2015-05-18 NOTE — Patient Instructions (Signed)
YOU HAD AN ENDOSCOPIC PROCEDURE TODAY AT THE [facility]:   Refer to the procedure report that was given to you for any specific questions about what was found during the examination.  If the procedure report does not answer your questions, please call your gastroenterologist to clarify.  If you requested that your care partner not be given the details of your procedure findings, then the procedure report has been included in a sealed envelope for you to review at your convenience later.  YOU SHOULD EXPECT: Some feelings of bloating in the abdomen. Passage of more gas than usual.  Walking can help get rid of the air that was put into your GI tract during the procedure and reduce the bloating. If you had a lower endoscopy (such as a colonoscopy or flexible sigmoidoscopy) you may notice spotting of blood in your stool or on the toilet paper. If you underwent a bowel prep for your procedure, you may not have a normal bowel movement for a few days.  Please Note:  You might notice some irritation and congestion in your nose or some drainage.  This is from the oxygen used during your procedure.  There is no need for concern and it should clear up in a day or so.  SYMPTOMS TO REPORT IMMEDIATELY:   Following lower endoscopy (colonoscopy or flexible sigmoidoscopy):  Excessive amounts of blood in the stool  Significant tenderness or worsening of abdominal pains  Swelling of the abdomen that is new, acute  Fever of 100F or higher   For urgent or emergent issues, a gastroenterologist can be reached at any hour by calling [phone].   DIET: Your first meal following the procedure should be a small meal and then it is ok to progress to your normal diet. Heavy or fried foods are harder to digest and may make you feel nauseous or bloated.  Likewise, meals heavy in dairy and vegetables can increase bloating.  Drink plenty of fluids but you should avoid alcoholic beverages for 24  hours.  ACTIVITY:  You should plan to take it easy for the rest of today and you should NOT DRIVE or use heavy machinery until tomorrow (because of the sedation medicines used during the test).    FOLLOW UP: Our staff will call the number listed on your records the next business day following your procedure to check on you and address any questions or concerns that you may have regarding the information given to you following your procedure. If we do not reach you, we will leave a message.  However, if you are feeling well and you are not experiencing any problems, there is no need to return our call.  We will assume that you have returned to your regular daily activities without incident.  If any biopsies were taken you will be contacted by phone or by letter within the next 1-3 weeks.  Please call us at [phone] if you have not heard about the biopsies in 3 weeks.    SIGNATURES/CONFIDENTIALITY: You and/or your care partner have signed paperwork which will be entered into your electronic medical record.  These signatures attest to the fact that that the information above on your After Visit Summary has been reviewed and is understood.  Full responsibility of the confidentiality of this discharge information lies with you and/or your care-partner. 

## 2015-05-18 NOTE — Progress Notes (Signed)
Pt. Back from procedure. Anxious, asks to go to bathroom.

## 2015-05-18 NOTE — Op Note (Signed)
[facility] Patient Name: Yvonne Harrell Procedure Date: 05/18/2015 2:30 PM MRN: [id number] Endoscopist: Napoleon Form , MD Age: 30 Referring MD:  Date of Birth: 1985-04-15 Gender: Female Procedure:                Colonoscopy Indications:              Clinically significant diarrhea of unexplained                            origin, Abnormal CT of the GI tract Medicines:                Propofol total dose 400 mg IV, Monitored Anesthesia                            Care Procedure:                Pre-Anesthesia Assessment:                           - Prior to the procedure, a History and Physical                            was performed, and patient medications and                            allergies were reviewed. The patient's tolerance of                            previous anesthesia was also reviewed. The risks                            and benefits of the procedure and the sedation                            options and risks were discussed with the patient.                            All questions were answered, and informed consent                            was obtained. Prior Anticoagulants: The patient has                            taken no previous anticoagulant or antiplatelet                            agents. ASA Grade Assessment: II - A patient with                            mild systemic disease. After reviewing the risks                            and benefits, the patient was deemed in  satisfactory condition to undergo the procedure.                           After obtaining informed consent, the colonoscope                            was passed under direct vision. Throughout the                            procedure, the patient's blood pressure, pulse, and                            oxygen saturations were monitored continuously. The                            Model CF-HQ190L [id number]) scope was introduced               through the anus and advanced to the the cecum,                            identified by appendiceal orifice and ileocecal                            valve. The colonoscopy was performed without                            difficulty. The patient tolerated the procedure                            well. The quality of the bowel preparation was                            fair. The ileocecal valve, appendiceal orifice, and                            rectum were photographed. Scope In: 3:10:22 PM Scope Out: 3:18:31 PM Scope Withdrawal Time: 0 hours 4 minutes 46 seconds  Total Procedure Duration: 0 hours 8 minutes 9 seconds  Findings:      The perianal and digital rectal examinations were normal. Pertinent       negatives include normal sphincter tone.      The colon (entire examined portion) appeared normal. Biopsies were taken       with a cold forceps for histology. Complications:            No immediate complications. Estimated Blood Loss:     Estimated blood loss: none. Impression:               - Preparation of the colon was fair.                           - The entire examined colon is normal. Biopsied. Recommendation:           - Patient has a contact number available for  emergencies. The signs and symptoms of potential                            delayed complications were discussed with the                            patient. Return to normal activities tomorrow.                            Written discharge instructions were provided to the                            patient.                           - Resume previous diet.                           - Continue present medications.                           - Await pathology results.                           - Repeat colonoscopy at age 30 for screening                            purposes.                           - Return to GI clinic PRN. Procedure Code(s):        --- Professional ---                            [phone], Colonoscopy, flexible; with biopsy, single                            or multiple CPT copyright 2016 [employer]. All rights reserved. Napoleon FormKavitha V. Nandigam, MD 05/18/2015 3:24:59 PM This report has been signed electronically. Number of Addenda: 0

## 2015-05-18 NOTE — Progress Notes (Signed)
A and O x3 Report to Jane RN 

## 2015-05-18 NOTE — Progress Notes (Signed)
Called to room to assist during endoscopic procedure.  Patient ID and intended procedure confirmed with present staff. Received instructions for my participation in the procedure from the performing physician.  

## 2015-05-19 ENCOUNTER — Telehealth: Payer: Self-pay

## 2015-05-19 NOTE — Telephone Encounter (Signed)
  Follow up Call-  Call back number 05/18/2015  Post procedure Call Back phone  # [phone]  Permission to leave phone message Yes    Patient was called for follow up after her procedure on 05/18/2015. No answer at the number given for follow up phone call. A message was left on the answering machine.

## 2015-05-29 ENCOUNTER — Encounter: Payer: Self-pay | Admitting: Gastroenterology

## 2016-05-17 ENCOUNTER — Telehealth: Payer: Self-pay | Admitting: Family Medicine

## 2016-05-17 NOTE — Telephone Encounter (Signed)
Pt scheduled appt with Dr Selena BattenKim 05/20/16. Nothing further needed at this time.

## 2016-05-17 NOTE — Telephone Encounter (Signed)
[facility] - Client TELEPHONE ADVICE RECORD [facility] Patient Name: Yvonne Harrell DOB: June 28, 1985 Initial Comment caller states she can't feel her feet, they are freezing and hurt when she tries to stand on them. And her hands are numb. Stopped taking BP meds as it was making BP to low Nurse Assessment Nurse: Debera Latalston, RN, Tinnie GensJeffrey Date/Time Lamount Cohen(Eastern Time): 05/17/2016 11:15:59 AM Confirm and document reason for call. If symptomatic, describe symptoms. ---Caller states she can't feel her feet, they are freezing and hurt when she tries to stand on them. And her hands are numb. Stopped taking BP meds as it was making BP to low. Symptoms started a week ago. Does the patient have any new or worsening symptoms? ---Yes Will a triage be completed? ---Yes Related visit to physician within the last 2 weeks? ---N/A Does the PT have any chronic conditions? (i.e. diabetes, asthma, etc.) ---Yes List chronic conditions. ---HTN Is the patient pregnant or possibly pregnant? (Ask all females between the ages of 5612-55) ---No Is this a behavioral health or substance abuse call? ---No Guidelines Guideline Title Affirmed Question Affirmed Notes Neurologic Deficit [1] Numbness (i.e., loss of sensation) of the face, arm / hand, or leg / foot on one side of the body AND [2] gradual onset (e.g., days to weeks) AND [3] present now Final Disposition User See Physician within 4 Hours (or PCP triage) Debera Latalston, RN, Tinnie GensJeffrey Comments Caller did not want to be seen by female practicioner today and advised she would call back after noon to see if appointment is available at [facility] for tomorrow. Referrals GO TO FACILITY REFUSED Disagree/Comply: Comply

## 2016-05-20 ENCOUNTER — Encounter: Payer: Self-pay | Admitting: Family Medicine

## 2016-05-20 ENCOUNTER — Ambulatory Visit (INDEPENDENT_AMBULATORY_CARE_PROVIDER_SITE_OTHER): Payer: 59 | Admitting: Family Medicine

## 2016-05-20 VITALS — BP 100/80 | HR 116 | Temp 98.6°F | Ht 67.0 in | Wt 134.1 lb

## 2016-05-20 DIAGNOSIS — R000 Tachycardia, unspecified: Secondary | ICD-10-CM | POA: Diagnosis not present

## 2016-05-20 DIAGNOSIS — E860 Dehydration: Secondary | ICD-10-CM | POA: Diagnosis not present

## 2016-05-20 DIAGNOSIS — R202 Paresthesia of skin: Secondary | ICD-10-CM

## 2016-05-20 DIAGNOSIS — A09 Infectious gastroenteritis and colitis, unspecified: Secondary | ICD-10-CM | POA: Diagnosis not present

## 2016-05-20 DIAGNOSIS — R197 Diarrhea, unspecified: Secondary | ICD-10-CM

## 2016-05-20 LAB — CBC
HCT: 41 % (ref 36.0–46.0)
Hemoglobin: 14.3 g/dL (ref 12.0–15.0)
MCHC: 34.8 g/dL (ref 30.0–36.0)
MCV: 100.5 fl — AB (ref 78.0–100.0)
PLATELETS: 158 10*3/uL (ref 150.0–400.0)
RBC: 4.08 Mil/uL (ref 3.87–5.11)
RDW: 13.8 % (ref 11.5–15.5)
WBC: 6.4 10*3/uL (ref 4.0–10.5)

## 2016-05-20 LAB — BASIC METABOLIC PANEL
BUN: 9 mg/dL (ref 6–23)
CALCIUM: 10.1 mg/dL (ref 8.4–10.5)
CHLORIDE: 91 meq/L — AB (ref 96–112)
CO2: 29 meq/L (ref 19–32)
Creatinine, Ser: 0.72 mg/dL (ref 0.40–1.20)
GFR: 99.93 mL/min (ref >60.00)
Glucose, Bld: 88 mg/dL (ref 70–99)
Potassium: 3.6 mEq/L (ref 3.5–5.1)
SODIUM: 138 meq/L (ref 135–145)

## 2016-05-20 LAB — TSH: TSH: 5.86 u[IU]/mL — ABNORMAL HIGH (ref 0.35–4.50)

## 2016-05-20 LAB — VITAMIN B12: Vitamin B-12: 336 pg/mL (ref 211–911)

## 2016-05-20 NOTE — Progress Notes (Signed)
Pre visit review using our clinic review tool, if applicable. No additional management support is needed unless otherwise documented below in the visit note. 

## 2016-05-20 NOTE — Progress Notes (Signed)
HPI:  Acute visit for NVD: -started 1 week ago -was in [place] and just got back last week -having nonbloddy, nonbilious vomiting several times per day and watery diarrhea 5-6 times per day -reports doing better today with not vomiting or diarrhea today -has been drinking water -no abd pain, fevers, rash, CP, SOB, DOE, leg swelling -report heart rate always up from anxiety at doctor -reports sensation of numbness in bilat hands and feet intermittently for 3 weeks - she thought may be circulation issues -denies new meds or exposures -reports out of all BP meds for some time -denies alcohol or drug use -denies an new medications -denies depression   ROS: See pertinent positives and negatives per HPI.  Past Medical History:  Diagnosis Date  . Alcohol use 04/26/2015  . Colitis   . Counseling for hormone replacement therapy    started replacement therapy on 10/21/11  . Esophageal reflux 12/28/2013  . Gender dysphoria - managed by Dr. Ruby ColaPittaway, reproductive gynecology 12/28/2013  . Hormone replacement therapy - managed by Dr. Ruby ColaPittaway 12/28/2013  . Hypertension   . Leukocytosis 12/28/2013  . Tobacco use disorder 12/28/2013    Past Surgical History:  Procedure Laterality Date  . BREAST ENHANCEMENT SURGERY  09/2013  . GENDER REASSIGNMENT  2016   female to female  . TONSILLECTOMY      Family History  Problem Relation Age of Onset  . Hypertension Father   . Hypertension Paternal Grandfather     Social History   Social History  . Marital status: Married    Spouse name: N/A  . Number of children: N/A  . Years of education: N/A   Social History Main Topics  . Smoking status: Current Every Day Smoker    Packs/day: 0.50    Types: Cigarettes  . Smokeless tobacco: Never Used  . Alcohol use 1.0 oz/week    2 drink(s) per week     Comment: occasionally  . Drug use: No  . Sexual activity: Yes    Birth control/ protection: None   Other Topics Concern  .  None   Social History Narrative   Work or School: Systems developeranalyst for time [employer]      Home Situation: lives with partner      Spiritual Beliefs: none      Lifestyle: no regular exercise; diet is ok              Current Outpatient Prescriptions:  .  estradiol (ESTRACE) 2 MG tablet, Take 2 mg by mouth 2 (two) times daily. , Disp: , Rfl:  .  lisinopril (PRINIVIL,ZESTRIL) 10 MG tablet, Take 1 tablet (10 mg total) by mouth daily., Disp: 90 tablet, Rfl: 3 .  ondansetron (ZOFRAN) 4 MG tablet, Take 1 tablet (4 mg total) by mouth every 8 (eight) hours as needed for nausea or vomiting., Disp: 12 tablet, Rfl: 0  EXAM:  Vitals:   05/20/16 1102  BP: 100/80  Pulse: (!) 116  Temp: 98.6 F (37 C)    Body mass index is 21 kg/m.  GENERAL: vitals reviewed and listed above, alert, oriented, appears well hydrated and in no acute distress  HEENT: atraumatic, conjunttiva clear, no obvious abnormalities on inspection of external nose and ears  NECK: no obvious masses on inspection  LUNGS: clear to auscultation bilaterally, no wheezes, rales or rhonchi, good air movement  CV: HRRR, no peripheral edema  MS: moves all extremities without noticeable abnormality  PSYCH: pleasant and cooperative, no obvious depression or  anxiety  ASSESSMENT AND PLAN:  Discussed the following assessment and plan:  Diarrhea of presumed infectious origin - Plan: Stool culture, Basic metabolic panel, CBC  Tachycardia - Plan: EKG 12-Lead  Dehydration  Paresthesia - Plan: TSH, Vitamin B12  -her diarrhea and vomiting have resolved so far today - if persistent may do stool culture given travel hx -suspect some volume depletion and illness playing a roll in sinus tachycardia and nonspecific TW/ST abnormalities; discussed with senior colleague whom reviewed EKG and case and with pt - both agreed and pt preferred oral hydration, labs for elextrolytes, thyroid check -will also check B12/cbc for paresthesias -imodium  and zofran if needed if further symptoms -close follow up in 1 week -Patient advised to return or notify a doctor immediately if symptoms worsen or persist or new concerns arise.  Patient Instructions  BEFORE YOU LEAVE: -labs -EKG -follow up: 1 week  Plenty of fluids with electrolytes - soup broth, gatorade, etc  No dairy x1 week after symptoms resolve  imodium for diarrhea  zofran for nausea/vomiting if needed  Seek emergency care immediately if worsening, not improving, dizzy, chest pain, shortness or breath or bleeding.      Kriste Basque R., DO

## 2016-05-20 NOTE — Patient Instructions (Addendum)
BEFORE YOU LEAVE: -labs -EKG -follow up: 1 week  Plenty of fluids with electrolytes - soup broth, gatorade, etc  No dairy x1 week after symptoms resolve  imodium for diarrhea  zofran for nausea/vomiting if needed  Seek emergency care immediately if worsening, not improving, dizzy, chest pain, shortness or breath or bleeding.

## 2016-05-27 ENCOUNTER — Ambulatory Visit: Payer: 59 | Admitting: Family Medicine

## 2016-05-27 NOTE — Progress Notes (Deleted)
  HPI:  Follow up diarrhea, mildly abnormal B12 and TSH on labs. See prior notes. Presumed infectious. Reports: Denies: Plan to repeat labs in a few weeks once over likely viral illness.  ROS: See pertinent positives and negatives per HPI.  Past Medical History:  Diagnosis Date  . Alcohol use 04/26/2015  . Colitis   . Counseling for hormone replacement therapy    started replacement therapy on 10/21/11  . Esophageal reflux 12/28/2013  . Gender dysphoria - managed by Dr. Ruby ColaPittaway, reproductive gynecology 12/28/2013  . Hormone replacement therapy - managed by Dr. Ruby ColaPittaway 12/28/2013  . Hypertension   . Leukocytosis 12/28/2013  . Tobacco use disorder 12/28/2013    Past Surgical History:  Procedure Laterality Date  . BREAST ENHANCEMENT SURGERY  09/2013  . GENDER REASSIGNMENT  2016   female to female  . TONSILLECTOMY      Family History  Problem Relation Age of Onset  . Hypertension Father   . Hypertension Paternal Grandfather     Social History   Social History  . Marital status: Married    Spouse name: N/A  . Number of children: N/A  . Years of education: N/A   Social History Main Topics  . Smoking status: Current Every Day Smoker    Packs/day: 0.50    Types: Cigarettes  . Smokeless tobacco: Never Used  . Alcohol use 1.0 oz/week    2 drink(s) per week     Comment: occasionally  . Drug use: No  . Sexual activity: Yes    Birth control/ protection: None   Other Topics Concern  . Not on file   Social History Narrative   Work or School: Systems developeranalyst for time [employer]      Home Situation: lives with partner      Spiritual Beliefs: none      Lifestyle: no regular exercise; diet is ok              Current Outpatient Prescriptions:  .  estradiol (ESTRACE) 2 MG tablet, Take 2 mg by mouth 2 (two) times daily. , Disp: , Rfl:  .  lisinopril (PRINIVIL,ZESTRIL) 10 MG tablet, Take 1 tablet (10 mg total) by mouth daily., Disp: 90 tablet, Rfl: 3 .  ondansetron (ZOFRAN) 4  MG tablet, Take 1 tablet (4 mg total) by mouth every 8 (eight) hours as needed for nausea or vomiting., Disp: 12 tablet, Rfl: 0  EXAM:  There were no vitals filed for this visit.  There is no height or weight on file to calculate BMI.  GENERAL: vitals reviewed and listed above, alert, oriented, appears well hydrated and in no acute distress  HEENT: atraumatic, conjunttiva clear, no obvious abnormalities on inspection of external nose and ears  NECK: no obvious masses on inspection  LUNGS: clear to auscultation bilaterally, no wheezes, rales or rhonchi, good air movement  CV: HRRR, no peripheral edema  MS: moves all extremities without noticeable abnormality  PSYCH: pleasant and cooperative, no obvious depression or anxiety  ASSESSMENT AND PLAN:  Discussed the following assessment and plan:  No diagnosis found.  -Patient advised to return or notify a doctor immediately if symptoms worsen or persist or new concerns arise.  There are no Patient Instructions on file for this visit.  Kriste BasqueKIM, HANNAH R., DO

## 2016-10-02 DEATH — deceased

## 2016-11-21 ENCOUNTER — Encounter: Payer: Self-pay | Admitting: Family Medicine

## 2017-03-13 ENCOUNTER — Encounter: Payer: Self-pay | Admitting: Family Medicine
